# Patient Record
Sex: Male | Born: 1990 | Race: Black or African American | Hispanic: No | Marital: Single | Smoking: Current some day smoker
Health system: Southern US, Community
[De-identification: ages and names within clinical notes are randomized; demographics above are authoritative.]

## PROBLEM LIST (undated history)

## (undated) DIAGNOSIS — R05 Cough: Secondary | ICD-10-CM

## (undated) DIAGNOSIS — J3501 Chronic tonsillitis: Secondary | ICD-10-CM

## (undated) HISTORY — PX: NO PAST SURGERIES: SHX2092

---

## 2011-11-25 ENCOUNTER — Encounter: Payer: Self-pay | Admitting: Adult Health

## 2011-11-25 ENCOUNTER — Emergency Department (HOSPITAL_COMMUNITY)
Admission: EM | Admit: 2011-11-25 | Discharge: 2011-11-25 | Disposition: A | Discharge status: Home / Self Care | Payer: Federal, State, Local not specified - PPO | Attending: Emergency Medicine | Admitting: Emergency Medicine

## 2011-11-25 DIAGNOSIS — H9209 Otalgia, unspecified ear: Secondary | ICD-10-CM | POA: Insufficient documentation

## 2011-11-25 DIAGNOSIS — R059 Cough, unspecified: Secondary | ICD-10-CM | POA: Insufficient documentation

## 2011-11-25 DIAGNOSIS — IMO0001 Reserved for inherently not codable concepts without codable children: Secondary | ICD-10-CM | POA: Insufficient documentation

## 2011-11-25 DIAGNOSIS — R5383 Other fatigue: Secondary | ICD-10-CM | POA: Insufficient documentation

## 2011-11-25 DIAGNOSIS — J029 Acute pharyngitis, unspecified: Secondary | ICD-10-CM | POA: Insufficient documentation

## 2011-11-25 DIAGNOSIS — J3489 Other specified disorders of nose and nasal sinuses: Secondary | ICD-10-CM | POA: Insufficient documentation

## 2011-11-25 DIAGNOSIS — F17200 Nicotine dependence, unspecified, uncomplicated: Secondary | ICD-10-CM | POA: Insufficient documentation

## 2011-11-25 DIAGNOSIS — R509 Fever, unspecified: Secondary | ICD-10-CM | POA: Insufficient documentation

## 2011-11-25 DIAGNOSIS — R599 Enlarged lymph nodes, unspecified: Secondary | ICD-10-CM | POA: Insufficient documentation

## 2011-11-25 DIAGNOSIS — R5381 Other malaise: Secondary | ICD-10-CM | POA: Insufficient documentation

## 2011-11-25 DIAGNOSIS — R05 Cough: Secondary | ICD-10-CM | POA: Insufficient documentation

## 2011-11-25 LAB — RAPID STREP SCREEN (MED CTR MEBANE ONLY): Streptococcus, Group A Screen (Direct): NEGATIVE

## 2011-11-25 MED ORDER — IBUPROFEN 800 MG PO TABS: 800.0000 mg | ORAL_TABLET | Freq: Three times a day (TID) | ORAL | Status: AC | PRN

## 2011-11-25 MED ORDER — ACETAMINOPHEN 325 MG PO TABS: 650.0000 mg | ORAL_TABLET | Freq: Once | ORAL | Status: DC

## 2011-11-25 MED ORDER — IBUPROFEN 800 MG PO TABS
800.0000 mg | ORAL_TABLET | Freq: Once | ORAL | Status: AC
  Administered 2011-11-25: 800 mg via ORAL
  Filled 2011-11-25: qty 1

## 2011-11-25 MED ORDER — AMOXICILLIN 875 MG PO TABS: 875.0000 mg | ORAL_TABLET | Freq: Two times a day (BID) | ORAL | Status: AC

## 2011-11-25 NOTE — ED Provider Notes (Signed)
History     CSN: [id number]  Arrival date & time 11/25/11  1338   First MD Initiated Contact with Patient 11/25/11 1553      Chief Complaint  Patient presents with  . Sore Throat    (Consider location/radiation/quality/duration/timing/severity/associated sxs/prior treatment) Patient is a 20 y.o. male presenting with pharyngitis. The history is provided by the patient.  Sore Throat This is a new problem. The current episode started yesterday. The problem occurs constantly. The problem has been gradually worsening. Associated symptoms include chills, congestion, coughing, fatigue, a fever, myalgias and a sore throat. Pertinent negatives include no abdominal pain, chest pain, headaches, nausea, neck pain, rash, urinary symptoms, vertigo, visual change, vomiting or weakness. The symptoms are aggravated by swallowing. Treatments tried: dayquil. The treatment provided no relief.  Pt did receive a flu shot this year.  Past Medical History  Diagnosis Date  . Tonsillitis     History reviewed. No pertinent past surgical history.  History reviewed. No pertinent family history.  History  Substance Use Topics  . Smoking status: Current Everyday Smoker  . Smokeless tobacco: Not on file  . Alcohol Use: Yes      Review of Systems  Constitutional: Positive for fever, chills and fatigue.  HENT: Positive for ear pain, congestion and sore throat. Negative for hearing loss, nosebleeds, rhinorrhea, drooling, mouth sores, trouble swallowing, neck pain, neck stiffness, voice change, tinnitus and ear discharge.   Respiratory: Positive for cough.   Cardiovascular: Negative for chest pain.  Gastrointestinal: Negative for nausea, vomiting and abdominal pain.  Musculoskeletal: Positive for myalgias. Negative for back pain and gait problem.  Skin: Negative for rash.  Neurological: Negative for vertigo, speech difficulty, weakness and headaches.    Allergies  Review of patient's allergies  indicates no known allergies.  Home Medications  No current outpatient prescriptions on file.  BP 123/65  Pulse 89  Temp(Src) 99.8 F (37.7 C) (Oral)  Resp 19  SpO2 100%  Physical Exam  Nursing note and vitals reviewed. Constitutional: He is oriented to person, place, and time. He appears well-developed and well-nourished. No distress.  HENT:  Head: Normocephalic and atraumatic. No trismus in the jaw.  Right Ear: Hearing, tympanic membrane, external ear and ear canal normal.  Left Ear: Hearing, tympanic membrane, external ear and ear canal normal.  Nose: Nose normal. Right sinus exhibits no maxillary sinus tenderness. Left sinus exhibits no maxillary sinus tenderness.  Mouth/Throat: Mucous membranes are normal. Normal dentition. No uvula swelling. Oropharyngeal exudate, posterior oropharyngeal edema and posterior oropharyngeal erythema present. No tonsillar abscesses.  Eyes: Conjunctivae are normal. Pupils are equal, round, and reactive to light.  Neck: Normal range of motion. Neck supple.  Cardiovascular: Normal rate, regular rhythm and normal heart sounds.   No murmur heard. Pulmonary/Chest: Effort normal and breath sounds normal. No respiratory distress. He has no wheezes. He exhibits no tenderness.       Normal respiratory effort and excursion  Abdominal: Soft. He exhibits no distension. There is no tenderness.  Musculoskeletal: Normal range of motion. He exhibits no edema and no tenderness.  Lymphadenopathy:       Bilateral tender anterior cervical LAD  Neurological: He is alert and oriented to person, place, and time.  Skin: Skin is warm and dry. No rash noted. No erythema.  Psychiatric: He has a normal mood and affect.    ED Course  Procedures (including critical care time)   Labs Reviewed  RAPID STREP SCREEN   No results found.  Dx 1: Pharyngitis   MDM  Exudative pharyngitis x 1 day. 3/4 Centor criteria. Strep screen ordered by nursing staff and is negative,  but will send for culture and treat given symptoms and high clinical suspicion for bacterial etiology. Have also advised use of motrin and warm salt water gargles for fever/discomfort.        Elwyn Reach [place], [place] 11/25/11 [phone]

## 2011-11-25 NOTE — ED Notes (Signed)
Cough, vomitting, fever and sore throat with redness and white patches.

## 2011-11-26 LAB — STREP A DNA PROBE
Group A Strep Probe: NEGATIVE
Special Requests: NORMAL

## 2011-11-26 NOTE — ED Provider Notes (Signed)
Medical screening examination/treatment/procedure(s) were performed by non-physician practitioner and as supervising physician I was immediately available for consultation/collaboration. Sahvannah Rieser Y.   Mekai Wilkinson Y. Galileo Colello, MD 11/26/11 1027 

## 2012-01-15 ENCOUNTER — Encounter: Payer: Self-pay | Admitting: Family Medicine

## 2012-01-15 ENCOUNTER — Ambulatory Visit (INDEPENDENT_AMBULATORY_CARE_PROVIDER_SITE_OTHER): Payer: Federal, State, Local not specified - PPO | Admitting: Family Medicine

## 2012-01-15 DIAGNOSIS — Z0000 Encounter for general adult medical examination without abnormal findings: Secondary | ICD-10-CM

## 2012-01-15 DIAGNOSIS — Z113 Encounter for screening for infections with a predominantly sexual mode of transmission: Secondary | ICD-10-CM

## 2012-01-15 HISTORY — DX: Encounter for general adult medical examination without abnormal findings: Z00.00

## 2012-01-15 LAB — COMPREHENSIVE METABOLIC PANEL
ALT: 16 U/L (ref 0–53)
AST: 24 U/L (ref 0–37)
Albumin: 4.1 g/dL (ref 3.5–5.2)
Alkaline Phosphatase: 60 U/L (ref 39–117)
Glucose, Bld: 81 mg/dL (ref 70–99)
Potassium: 3.7 mEq/L (ref 3.5–5.3)
Sodium: 139 mEq/L (ref 135–145)
Total Bilirubin: 0.9 mg/dL (ref 0.3–1.2)
Total Protein: 7.3 g/dL (ref 6.0–8.3)

## 2012-01-15 LAB — CBC
Hemoglobin: 14.7 g/dL (ref 13.0–17.0)
MCH: 30.1 pg (ref 26.0–34.0)
MCHC: 35.2 g/dL (ref 30.0–36.0)
Platelets: 221 10*3/uL (ref 150–400)

## 2012-01-15 NOTE — Patient Instructions (Signed)

## 2012-01-16 NOTE — Progress Notes (Signed)
Quick Note:  Please notify pt that results are normal. ______ 

## 2012-01-16 NOTE — Progress Notes (Signed)
  Subjective:    Patient ID: Todd Mahoney, male    DOB: Sep 10, 1991, 21 y.o.   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  His Immunizations are UTD . He is a smoker ( 1/4 pack per day) since midteen years.He is   interested in quitting. He is a heterosexual male who has one partner and uses condoms.   Review of Systems  All other systems reviewed and are negative.       Objective:   Physical Exam  Nursing note and vitals reviewed. Constitutional: He is oriented to person, place, and time. He appears well-developed and well-nourished. No distress.  HENT:  Head: Normocephalic and atraumatic.  Right Ear: External ear normal.  Left Ear: External ear normal.  Nose: Nose normal.  Mouth/Throat: Oropharynx is clear and moist.  Eyes: Conjunctivae and EOM are normal. Pupils are equal, round, and reactive to light. Scleral icterus is present.  Neck: Normal range of motion. Neck supple. No thyromegaly present.  Cardiovascular: Normal rate, regular rhythm, normal heart sounds and intact distal pulses.   No murmur heard. Pulmonary/Chest: Effort normal and breath sounds normal. No respiratory distress. He has no wheezes.  Abdominal: Soft. Bowel sounds are normal. He exhibits no distension and no mass. There is no tenderness.  Genitourinary: Penis normal. No penile tenderness.       No penile discharge. Testicles normal and bilat desc. without masses or tenderness  Musculoskeletal: Normal range of motion. He exhibits no edema and no tenderness.  Lymphadenopathy:    He has no cervical adenopathy.  Neurological: He is alert and oriented to person, place, and time. He has normal reflexes. No cranial nerve deficit. Coordination normal.  Skin: Skin is warm and dry.  Psychiatric: He has a normal mood and affect. His behavior is normal. Judgment and thought content normal.          Assessment & Plan:   1. Routine general medical  examination at a health care facility  CBC, Comprehensive metabolic panel  2. Screen for STD (sexually transmitted disease)  HIV antibody; advised to continue safe sex practices  3. Health care maintenance  Tobacco cessation encouraged

## 2012-02-29 ENCOUNTER — Ambulatory Visit: Payer: Federal, State, Local not specified - PPO | Admitting: Family Medicine

## 2012-03-18 ENCOUNTER — Ambulatory Visit (INDEPENDENT_AMBULATORY_CARE_PROVIDER_SITE_OTHER): Payer: Federal, State, Local not specified - PPO | Admitting: Family Medicine

## 2012-03-18 DIAGNOSIS — F17200 Nicotine dependence, unspecified, uncomplicated: Secondary | ICD-10-CM

## 2012-03-18 DIAGNOSIS — J309 Allergic rhinitis, unspecified: Secondary | ICD-10-CM

## 2012-03-18 DIAGNOSIS — Z720 Tobacco use: Secondary | ICD-10-CM

## 2012-03-18 DIAGNOSIS — J302 Other seasonal allergic rhinitis: Secondary | ICD-10-CM

## 2012-03-18 MED ORDER — CETIRIZINE HCL 10 MG PO TABS: 10.0000 mg | ORAL_TABLET | Freq: Every day | ORAL | Status: DC

## 2012-03-18 NOTE — Progress Notes (Signed)
  Subjective:    Patient ID: Todd Mahoney, male    DOB: 10/13/1991, 21 y.o.   MRN: [id number]  HPI  This young man c/o 4-day hx ofdry cough, awakening him in middle of night. Also itchy, watery eyes  and sore throat. He spends some time outdoors walking to classes on college campus. He smokes  ~ 1 pack per week; seriously planning to quit. No prior hx of severe allergies. He also c/o bilat arm   discomfort,localized around elbows; this has resolved and he thinks it is related to his sleep positions.  The discomfort was not severe enough for oral analgesics. He had no swelling or decreased ROM or  warmth in the joints.    Review of Systems  Constitutional: Negative.   HENT: Positive for congestion, sore throat, rhinorrhea and sneezing. Negative for nosebleeds, trouble swallowing and sinus pressure.   Eyes: Positive for redness and itching.  Respiratory: Positive for cough. Negative for choking, chest tightness, shortness of breath and wheezing.   Cardiovascular: Positive for chest pain.  Skin: Negative.   Neurological: Negative for dizziness and headaches.       Objective:   Physical Exam  Nursing note and vitals reviewed. Constitutional: He is oriented to person, place, and time. He appears well-developed and well-nourished. No distress.  HENT:  Head: Normocephalic and atraumatic.  Right Ear: External ear normal.  Left Ear: External ear normal.  Mouth/Throat: No oropharyngeal exudate.       Post pharynx with redness and cobblestoning; nasal mucosa red and boggy with clear nasal drainage  Eyes: EOM are normal. Right eye exhibits no discharge. Left eye exhibits no discharge. No scleral icterus.       Conj injected  Neck: Neck supple.  Cardiovascular: Normal rate.   Pulmonary/Chest: Effort normal and breath sounds normal. No respiratory distress.  Musculoskeletal: Normal range of motion. He exhibits no edema and no tenderness.  Lymphadenopathy:    He has no cervical adenopathy.    Neurological: He is alert and oriented to person, place, and time.  Skin: Skin is warm and dry. No rash noted.          Assessment & Plan:   1. Allergic rhinitis      RX: Cetirizine HCl 10 mg   1 tablet daily in the PM   2. Tobacco user    Strongly advised cessation

## 2012-03-18 NOTE — Patient Instructions (Addendum)
Allergic Rhinitis Allergic rhinitis is when the mucous membranes in the nose respond to allergens. Allergens are particles in the air that cause your body to have an allergic reaction. This causes you to release allergic antibodies. Through a chain of events, these eventually cause you to release histamine into the blood stream (hence the use of antihistamines). Although meant to be protective to the body, it is this release that causes your discomfort, such as frequent sneezing, congestion and an itchy runny nose.  CAUSES  The pollen allergens may come from grasses, trees, and weeds. This is seasonal allergic rhinitis, or "hay fever." Other allergens cause year-round allergic rhinitis (perennial allergic rhinitis) such as house dust mite allergen, pet dander and mold spores.  SYMPTOMS   Nasal stuffiness (congestion).   Runny, itchy nose with sneezing and tearing of the eyes.   There is often an itching of the mouth, eyes and ears.  It cannot be cured, but it can be controlled with medications. DIAGNOSIS  If you are unable to determine the offending allergen, skin or blood testing may find it. TREATMENT   Avoid the allergen.   Medications and allergy shots (immunotherapy) can help.   Hay fever may often be treated with antihistamines in pill or nasal spray forms. Antihistamines block the effects of histamine. There are over-the-counter medicines that may help with nasal congestion and swelling around the eyes. Check with your caregiver before taking or giving this medicine.  If the treatment above does not work, there are many new medications your caregiver can prescribe. Stronger medications may be used if initial measures are ineffective. Desensitizing injections can be used if medications and avoidance fails. Desensitization is when a patient is given ongoing shots until the body becomes less sensitive to the allergen. Make sure you follow up with your caregiver if problems continue. SEEK  MEDICAL CARE IF:   You develop fever (more than 100.5 F (38.1 C).   You develop a cough that does not stop easily (persistent).   You have shortness of breath.   You start wheezing.   Symptoms interfere with normal daily activities.  Document Released: 08/07/2001 Document Revised: 11/01/2011 Document Reviewed: 02/16/2009 [facility] Patient Information 2012 [place], [place].   Discontinue tobacco use !!

## 2012-03-19 ENCOUNTER — Encounter: Payer: Self-pay | Admitting: Family Medicine

## 2012-03-19 DIAGNOSIS — Z720 Tobacco use: Secondary | ICD-10-CM | POA: Insufficient documentation

## 2012-03-19 DIAGNOSIS — J302 Other seasonal allergic rhinitis: Secondary | ICD-10-CM | POA: Insufficient documentation

## 2012-05-12 ENCOUNTER — Telehealth: Payer: Self-pay

## 2012-05-12 NOTE — Telephone Encounter (Signed)
Pt called and states that "[employer]" faxed [place] a form last month to confirm the health of his arms. He is calling to confirm this form has been completed and returned to [facility] as he has not heard anything from [place].

## 2012-05-13 NOTE — Telephone Encounter (Signed)
MR-- Please see if form has been completed, if not-- pull chart for clinical.

## 2012-05-14 NOTE — Telephone Encounter (Signed)
Chart is at the nurses station in the phone message pile.

## 2012-05-14 NOTE — Telephone Encounter (Signed)
[facility] notifying patient we do not appear to have received form.  Please re-fax and we can complete.  CB if any questions.

## 2012-05-14 NOTE — Telephone Encounter (Signed)
Patient does not have a chart he has only been seen in Epic.

## 2012-06-10 ENCOUNTER — Encounter: Payer: Self-pay | Admitting: Family Medicine

## 2012-10-09 ENCOUNTER — Emergency Department (HOSPITAL_COMMUNITY)
Admission: EM | Admit: 2012-10-09 | Discharge: 2012-10-09 | Disposition: A | Discharge status: Home / Self Care | Payer: Federal, State, Local not specified - PPO | Source: Home / Self Care | Attending: Family Medicine | Admitting: Family Medicine

## 2012-10-09 ENCOUNTER — Encounter (HOSPITAL_COMMUNITY): Payer: Self-pay | Admitting: *Deleted

## 2012-10-09 DIAGNOSIS — L0201 Cutaneous abscess of face: Secondary | ICD-10-CM

## 2012-10-09 DIAGNOSIS — J329 Chronic sinusitis, unspecified: Secondary | ICD-10-CM

## 2012-10-09 MED ORDER — FLUTICASONE PROPIONATE 50 MCG/ACT NA SUSP: 2.0000 | Freq: Every day | NASAL | Status: DC

## 2012-10-09 MED ORDER — DOXYCYCLINE HYCLATE 100 MG PO CAPS: 100.0000 mg | ORAL_CAPSULE | Freq: Two times a day (BID) | ORAL | Status: DC

## 2012-10-09 MED ORDER — CETIRIZINE-PSEUDOEPHEDRINE ER 5-120 MG PO TB12: 1.0000 | ORAL_TABLET | Freq: Two times a day (BID) | ORAL | Status: DC

## 2012-10-09 MED ORDER — PREDNISONE 20 MG PO TABS: ORAL_TABLET | ORAL | Status: DC

## 2012-10-09 MED ORDER — IBUPROFEN 600 MG PO TABS: 600.0000 mg | ORAL_TABLET | Freq: Three times a day (TID) | ORAL | Status: DC | PRN

## 2012-10-09 NOTE — ED Notes (Signed)
Pt reports sore throat, ear ache and abscess on chin.

## 2012-10-12 LAB — CULTURE, ROUTINE-ABSCESS: Gram Stain: NONE SEEN

## 2012-10-13 NOTE — ED Provider Notes (Signed)
History     CSN: [id number]  Arrival date & time 10/09/12  1305   First MD Initiated Contact with Patient 10/09/12 1326      Chief Complaint  Patient presents with  . URI  . Abscess  . Sore Throat    (Consider location/radiation/quality/duration/timing/severity/associated sxs/prior treatment) HPI Comments: 21 y/o smoker male with h/o chronic rhinitis here with following complaints:  1) nasal congestion, yellow rhinorrhea, sinus pressure, headache, nasal voice. States he has had allergy symptoms for several weeks but symptoms worse in last 2 weeks.  Now also with sore throat, nausea and decreased appetite. Denies cough, chest pain , wheezing or shortness of breath. No fever.   2) abscess in chin for 3 days. Area tender and swollen try to express but no drainage.    Past Medical History  Diagnosis Date  . Tonsillitis     History reviewed. No pertinent past surgical history.  Family History  Problem Relation Age of Onset  . Family history unknown: Yes    History  Substance Use Topics  . Smoking status: Current Every Day Smoker -- 0.5 packs/day for 8 years    Types: Cigarettes  . Smokeless tobacco: Not on file  . Alcohol Use: No      Review of Systems  Constitutional: Positive for appetite change. Negative for fever, chills, activity change and fatigue.  HENT: Positive for ear pain, congestion, sore throat, rhinorrhea and sinus pressure.   Eyes: Negative for discharge.  Respiratory: Negative for cough, shortness of breath and wheezing.   Cardiovascular: Negative for chest pain.  Gastrointestinal: Positive for nausea. Negative for vomiting, abdominal pain and diarrhea.  Musculoskeletal: Negative for myalgias and arthralgias.  Skin:       Tender swollen area in chin as per HPI.  Neurological: Positive for headaches.  All other systems reviewed and are negative.    Allergies  Review of patient's allergies indicates no known allergies.  Home Medications    Current Outpatient Rx  Name  Route  Sig  Dispense  Refill  . CETIRIZINE-PSEUDOEPHEDRINE ER 5-120 MG PO TB12   Oral   Take 1 tablet by mouth 2 (two) times daily.   30 tablet   0   . DOXYCYCLINE HYCLATE 100 MG PO CAPS   Oral   Take 1 capsule (100 mg total) by mouth 2 (two) times daily.   20 capsule   0   . FLUTICASONE PROPIONATE 50 MCG/ACT NA SUSP   Nasal   Place 2 sprays into the nose daily.   16 g   0   . IBUPROFEN 600 MG PO TABS   Oral   Take 1 tablet (600 mg total) by mouth every 8 (eight) hours as needed for pain.   20 tablet   0   . PREDNISONE 20 MG PO TABS      2 tabs by mouth daily for 5 days   10 tablet   0     BP 131/80  Pulse 98  Temp 98.9 F (37.2 C) (Oral)  Resp 17  SpO2 99%  Physical Exam  Nursing note and vitals reviewed. Constitutional: He is oriented to person, place, and time. He appears well-developed and well-nourished. No distress.  HENT:  Head: Normocephalic and atraumatic.       Nasal Congestion with erythema and swelling of nasal turbinates, yellow rhinorrhea. Significant pharyngeal erythema no exudates. No uvula deviation. No trismus. TM's with increased vascular markings and some dullness bilaterally no swelling or bulging. Wax buildup  bilaterally without complete obstruction.   Eyes: Conjunctivae normal and EOM are normal. Pupils are equal, round, and reactive to light. Right eye exhibits no discharge. Left eye exhibits no discharge.  Neck: Neck supple. No thyromegaly present.  Cardiovascular: Normal rate, regular rhythm and normal heart sounds.   Pulmonary/Chest: Effort normal and breath sounds normal. He has no wheezes. He has no rales.  Lymphadenopathy:    He has no cervical adenopathy.  Neurological: He is alert and oriented to person, place, and time.  Skin: He is not diaphoretic.       Area of 2 cm swelling, erythema, induration and tenderness in left lower chin.     ED Course  INCISION AND DRAINAGE Performed by:  Sharin Grave Authorized by: Sharin Grave Consent: Verbal consent obtained. Risks and benefits: risks, benefits and alternatives were discussed Consent given by: patient Patient understanding: patient states understanding of the procedure being performed Patient consent: the patient's understanding of the procedure matches consent given Type: abscess Body area: head/neck Location details: face Anesthesia: local infiltration Local anesthetic: lidocaine 1% with epinephrine Anesthetic total: 1 ml Scalpel size: 11 Incision type: single straight Complexity: simple Drainage: purulent Drainage amount: scant Patient tolerance: Patient tolerated the procedure well with no immediate complications.   (including critical care time)   Labs Reviewed  CULTURE, ROUTINE-ABSCESS  LAB REPORT - SCANNED   No results found.   1. Sinusitis   2. Abscess of face       MDM  Face abscess s/p I&D today. Treated with doxycycline, Flonase, ibuprofen and prednsisone. Wound care instructions and red flags that should prompt return to medical attention provided.        Sharin Grave, MD 10/15/12 [phone]

## 2012-10-16 NOTE — ED Notes (Signed)
Abscess culture L face: few Staph. Aureus.  Pt. adequately treated with Doxycycline. Vassie Moselle 10/16/2012

## 2013-02-27 ENCOUNTER — Encounter: Payer: Federal, State, Local not specified - PPO | Admitting: Family Medicine

## 2013-04-14 ENCOUNTER — Encounter: Payer: Self-pay | Admitting: Family Medicine

## 2013-04-14 ENCOUNTER — Ambulatory Visit (INDEPENDENT_AMBULATORY_CARE_PROVIDER_SITE_OTHER): Payer: Federal, State, Local not specified - PPO | Admitting: Family Medicine

## 2013-04-14 VITALS — BP 119/78 | HR 58 | Temp 98.0°F | Resp 16 | Ht 70.5 in | Wt 208.0 lb

## 2013-04-14 DIAGNOSIS — Z0000 Encounter for general adult medical examination without abnormal findings: Secondary | ICD-10-CM

## 2013-04-14 NOTE — Patient Instructions (Addendum)
Keeping you healthy  Get these tests  Blood pressure- Have your blood pressure checked once a year by your healthcare provider.  Normal blood pressure is 120/80.  Weight- Have your body mass index (BMI) calculated to screen for obesity.  BMI is a measure of body fat based on height and weight. You can also calculate your own BMI at https://www.west-esparza.com/.  Cholesterol- Have your cholesterol checked regularly starting at age 22, sooner may be necessary if you have diabetes, high blood pressure, if a family member developed heart diseases at an early age or if you smoke.   Chlamydia, HIV, and other sexual transmitted disease- Get screened each year until the age of 20 then within three months of each new sexual partner.  Diabetes- Have your blood sugar checked regularly if you have high blood pressure, high cholesterol, a family history of diabetes or if you are overweight.  Get these vaccines  Flu shot- Every fall.  Tetanus shot- Every 10 years.  Menactra- Single dose; prevents meningitis.  Take these steps  Don't smoke- If you do smoke, ask your healthcare provider about quitting. For tips on how to quit, go to www.smokefree.gov or call 1-800-QUIT-NOW.  Be physically active- Exercise 5 days a week for at least 30 minutes.  If you are not already physically active start slow and gradually work up to 30 minutes of moderate physical activity.  Examples of moderate activity include walking briskly, mowing the yard, dancing, swimming bicycling, etc.  Eat a healthy diet- Eat a variety of healthy foods such as fruits, vegetables, low fat milk, low fat cheese, yogurt, lean meats, poultry, fish, beans, tofu, etc.  For more information on healthy eating, go to www.thenutritionsource.org  Drink alcohol in moderation- Limit alcohol intake two drinks or less a day.  Never drink and drive.  Dentist- Brush and floss teeth twice daily; visit your dentis twice a year.  Depression-Your emotional  health is as important as your physical health.  If you're feeling down, losing interest in things you normally enjoy please talk with your healthcare provider.  Gun Safety- If you keep a gun in your home, keep it unloaded and with the safety lock on.  Bullets should be stored separately.  Helmet use- Always wear a helmet when riding a motorcycle, bicycle, rollerblading or skateboarding.  Safe sex- If you may be exposed to a sexually transmitted infection, use a condom  Seat belts- Seat bels can save your life; always wear one.  Smoke/Carbon Monoxide detectors- These detectors need to be installed on the appropriate level of your home.  Replace batteries at least once a year.  Skin Cancer- When out in the sun, cover up and use sunscreen SPF 15 or higher.  Violence- If anyone is threatening or hurting you, please tell your healthcare provider.   Skin care regimen- use a mild cleanser on your face; try Cetaphil and wash face in morning and in evening before going to bed.  Also use a product called "Bio-Oil" to moisturize and even out your skin ton (help to fade the lesion on your left cheek). If the area on your cheek does not resolve or gets larger, schedule a follow-up visit.

## 2013-04-14 NOTE — Progress Notes (Signed)
Subjective:    Patient ID: Todd Mahoney, male    DOB: 14-Feb-1991, 22 y.o.   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 He had STD testing  on campus at  [employer] ~ 1 month ago. Allergic rhinitis symptoms are mild and pt rarely uses  Fluticasone. He is in a monogamous heterosexual relationship and uses condoms.  Girlfriend uses OCPs. 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   Pt  Is a 1/2 ppd smoker and consumes 2-3 beers, twice a month.  He does exercise 2x/week for about 2 hours.   PMHx, Soc Hx and Fam Hx reviewed.   Review of Systems  HENT: Positive for rhinorrhea and sneezing. Negative for congestion, sore throat, trouble swallowing, voice change, postnasal drip and sinus pressure.   Eyes: Positive for redness and itching. Negative for pain and discharge.  Skin: Positive for color change.       Dark flat lesion on L cheek- pt noticed spot one morning; no itch or abrasion or break in skin. No change in size over 1 month. Uses shower gel to cleanse face.  All other systems reviewed and are negative.       Objective:   Physical Exam  Nursing note and vitals reviewed. Constitutional: He is oriented to person, place, and time. Vital signs are normal. He appears well-developed and well-nourished. No distress.  HENT:  Head: Normocephalic and atraumatic.  Right Ear: Hearing, tympanic membrane, external ear and ear canal normal.  Left Ear: Hearing, tympanic membrane, external ear and ear canal normal.  Nose: Nose normal. No mucosal edema, rhinorrhea, nasal deformity or septal deviation.  Mouth/Throat: Uvula is midline and mucous membranes are normal. No oral lesions. Normal dentition. No dental caries. Posterior oropharyngeal erythema present. No oropharyngeal exudate or posterior oropharyngeal edema.  Tonsild are 1+ enlarged with few crypts noted.  Eyes: EOM and lids are normal. Pupils are equal, round, and  reactive to light. Right conjunctiva is injected. Left conjunctiva is injected. No scleral icterus.  Fundoscopic exam:      The right eye shows no arteriolar narrowing, no AV nicking and no papilledema. The right eye shows red reflex.       The left eye shows no arteriolar narrowing, no AV nicking and no papilledema. The left eye shows red reflex.  Pt wears contacts and has vision evaluation every 12-18 months.  Neck: Normal range of motion. Neck supple. No JVD present. No thyromegaly present.  Thyroid gland is slightly enlarged but this is symmetric w/o nodules; gland is not tender.  Cardiovascular: Normal rate, regular rhythm, normal heart sounds and intact distal pulses.  Exam reveals no gallop and no friction rub.   No murmur heard. Pulmonary/Chest: Effort normal and breath sounds normal. No respiratory distress. He has no wheezes. He has no rales.  Abdominal: Soft. Normal appearance and bowel sounds are normal. He exhibits no distension and no mass. There is no hepatosplenomegaly. There is no tenderness. There is no guarding and no CVA tenderness.  Genitourinary:  Exam deferred.  Musculoskeletal: Normal range of motion. He exhibits no edema and no tenderness.  Lymphadenopathy:    He has no cervical adenopathy.  Neurological: He is alert and oriented to person, place, and time. He has normal reflexes. No cranial nerve deficit. He exhibits normal muscle tone. Coordination normal.  Skin: Skin is warm and dry. No rash noted. No erythema.  Facial area- L  lower cheek w/ 0.5 cm flat hyperpigmented lesion (c/w post-abrasion scar).  Psychiatric: He has a normal mood and affect. His behavior is normal. Judgment and thought content normal.         Assessment & Plan:  Routine general medical examination at a health care facility- Healthy young adult male; he is given some skin care advise  (use mild cleanser like Cetaphil on face AM and PM; Bio-Oil for even skin tone and minimize  discoloration-use a few drops 2x/daily). RTC if skin lesion does not improve or worsens or new lesions appear.

## 2016-03-26 DIAGNOSIS — J3501 Chronic tonsillitis: Secondary | ICD-10-CM

## 2016-03-26 HISTORY — DX: Chronic tonsillitis: J35.01

## 2016-04-02 ENCOUNTER — Other Ambulatory Visit: Payer: Self-pay | Admitting: Otolaryngology

## 2016-04-02 NOTE — H&P (Signed)
PREOPERATIVE H&P  Chief Complaint: frequent tonsil infections  HPI: Todd Mahoney is a 25 y.o. male who presents for evaluation of frequent tonsil infections. He is a Consulting civil engineerstudent at [employer]. He's recently been treated for 2 separate infections and is doing better now. He's always had large tonsils and has previously been recommended tonsillectomy. He's taken to the OR for tonsillectomy.  Past Medical History  Diagnosis Date  . Tonsillitis    No past surgical history on file. Social History   Social History  . Marital Status: Single    Spouse Name: N/A  . Number of Children: N/A  . Years of Education: N/A   Occupational History  . student    Social History Main Topics  . Smoking status: Current Every Day Smoker -- 0.50 packs/day for 8 years    Types: Cigarettes  . Smokeless tobacco: Not on file  . Alcohol Use: No  . Drug Use: No  . Sexual Activity: Yes   Other Topics Concern  . Not on file   Social History Narrative  . No narrative on file   Family History  Problem Relation Age of Onset  . Diabetes Mother    No Known Allergies Prior to Admission medications   Medication Sig Start Date End Date Taking? Authorizing Provider  cetirizine-pseudoephedrine (ZYRTEC-D) 5-120 MG per tablet Take 1 tablet by mouth 2 (two) times daily. 10/09/12   [name], MD  fluticasone (FLONASE) 50 MCG/ACT nasal spray Place 2 sprays into the nose daily. 10/09/12   [name], MD  ibuprofen (ADVIL,MOTRIN) 600 MG tablet Take 1 tablet (600 mg total) by mouth every 8 (eight) hours as needed for pain. 10/09/12   [name], MD     Positive ROS: per HPI  All other systems have been reviewed and were otherwise negative with the exception of those mentioned in the HPI and as above.  Physical Exam: There were no vitals filed for this visit.  General: Alert, no acute distress Oral: Normal oral mucosa and 3+ tonsils Nasal: Clear nasal passages Neck: No palpable adenopathy or  thyroid nodules Ear: Ear canal is clear with normal appearing TMs Cardiovascular: Regular rate and rhythm, no murmur.  Respiratory: Clear to auscultation Neurologic: Alert and oriented x 3   Assessment/Plan: CHRONIC TONSILLITIS Plan for Procedure(s): BILATERAL TONSILLECTOMY   Dillard CannonHRISTOPHER NEWMAN, MD 04/02/2016 5:11 PM

## 2016-04-03 ENCOUNTER — Encounter (HOSPITAL_BASED_OUTPATIENT_CLINIC_OR_DEPARTMENT_OTHER): Payer: Self-pay | Admitting: *Deleted

## 2016-04-03 DIAGNOSIS — R059 Cough, unspecified: Secondary | ICD-10-CM

## 2016-04-03 HISTORY — DX: Cough, unspecified: R05.9

## 2016-04-06 ENCOUNTER — Encounter (HOSPITAL_BASED_OUTPATIENT_CLINIC_OR_DEPARTMENT_OTHER)
Admission: RE | Disposition: A | Discharge status: Home / Self Care | Payer: Self-pay | Source: Ambulatory Visit | Attending: Otolaryngology

## 2016-04-06 ENCOUNTER — Ambulatory Visit (HOSPITAL_BASED_OUTPATIENT_CLINIC_OR_DEPARTMENT_OTHER)
Admission: RE | Admit: 2016-04-06 | Discharge: 2016-04-06 | Disposition: A | Discharge status: Home / Self Care | Payer: BLUE CROSS/BLUE SHIELD | Source: Ambulatory Visit | Attending: Otolaryngology | Admitting: Otolaryngology

## 2016-04-06 ENCOUNTER — Ambulatory Visit (HOSPITAL_BASED_OUTPATIENT_CLINIC_OR_DEPARTMENT_OTHER): Payer: BLUE CROSS/BLUE SHIELD | Admitting: Anesthesiology

## 2016-04-06 ENCOUNTER — Encounter (HOSPITAL_BASED_OUTPATIENT_CLINIC_OR_DEPARTMENT_OTHER): Payer: Self-pay | Admitting: *Deleted

## 2016-04-06 DIAGNOSIS — J3501 Chronic tonsillitis: Secondary | ICD-10-CM | POA: Insufficient documentation

## 2016-04-06 DIAGNOSIS — F17210 Nicotine dependence, cigarettes, uncomplicated: Secondary | ICD-10-CM | POA: Insufficient documentation

## 2016-04-06 HISTORY — DX: Chronic tonsillitis: J35.01

## 2016-04-06 HISTORY — PX: TONSILLECTOMY: SHX5217

## 2016-04-06 HISTORY — DX: Cough: R05

## 2016-04-06 SURGERY — TONSILLECTOMY
Anesthesia: General | Site: Mouth | Laterality: Bilateral

## 2016-04-06 MED ORDER — HYDRALAZINE HCL 20 MG/ML IJ SOLN
5.0000 mg | Freq: Once | INTRAMUSCULAR | Status: AC
  Administered 2016-04-06: 5 mg via INTRAVENOUS

## 2016-04-06 MED ORDER — PROPOFOL 10 MG/ML IV BOLUS
INTRAVENOUS | Status: AC
  Filled 2016-04-06: qty 20

## 2016-04-06 MED ORDER — LABETALOL HCL 5 MG/ML IV SOLN
10.0000 mg | INTRAVENOUS | Status: DC | PRN
  Administered 2016-04-06: 10 mg via INTRAVENOUS

## 2016-04-06 MED ORDER — DEXAMETHASONE SODIUM PHOSPHATE 4 MG/ML IJ SOLN
INTRAMUSCULAR | Status: DC | PRN
  Administered 2016-04-06: 10 mg via INTRAVENOUS

## 2016-04-06 MED ORDER — ONDANSETRON HCL 4 MG/2ML IJ SOLN: 4.0000 mg | Freq: Once | INTRAMUSCULAR | Status: DC | PRN

## 2016-04-06 MED ORDER — FENTANYL CITRATE (PF) 100 MCG/2ML IJ SOLN
INTRAMUSCULAR | Status: AC
  Filled 2016-04-06: qty 2

## 2016-04-06 MED ORDER — FENTANYL CITRATE (PF) 100 MCG/2ML IJ SOLN
50.0000 ug | INTRAMUSCULAR | Status: AC | PRN
  Administered 2016-04-06: 100 ug via INTRAVENOUS
  Administered 2016-04-06: 50 ug via INTRAVENOUS
  Administered 2016-04-06: 100 ug via INTRAVENOUS

## 2016-04-06 MED ORDER — HYDROCODONE-ACETAMINOPHEN 7.5-325 MG/15ML PO SOLN: 10.0000 mL | ORAL | Status: DC | PRN

## 2016-04-06 MED ORDER — HYDRALAZINE HCL 20 MG/ML IJ SOLN
3.0000 mg | Freq: Once | INTRAMUSCULAR | Status: AC
  Administered 2016-04-06: 3 mg via INTRAVENOUS

## 2016-04-06 MED ORDER — LIDOCAINE HCL (CARDIAC) 20 MG/ML IV SOLN
INTRAVENOUS | Status: DC | PRN
  Administered 2016-04-06: 50 mg via INTRAVENOUS

## 2016-04-06 MED ORDER — SUCCINYLCHOLINE CHLORIDE 20 MG/ML IJ SOLN
INTRAMUSCULAR | Status: DC | PRN
  Administered 2016-04-06: 120 mg via INTRAVENOUS

## 2016-04-06 MED ORDER — OXYCODONE HCL 5 MG/5ML PO SOLN
5.0000 mg | Freq: Once | ORAL | Status: AC
Start: 2016-04-06 — End: 2016-04-06
  Administered 2016-04-06: 5 mg via ORAL

## 2016-04-06 MED ORDER — AMOXICILLIN 250 MG/5ML PO SUSR: 500.0000 mg | Freq: Two times a day (BID) | ORAL | Status: AC

## 2016-04-06 MED ORDER — SCOPOLAMINE 1 MG/3DAYS TD PT72: 1.0000 | MEDICATED_PATCH | Freq: Once | TRANSDERMAL | Status: DC | PRN

## 2016-04-06 MED ORDER — LACTATED RINGERS IV SOLN
INTRAVENOUS | Status: DC
  Administered 2016-04-06 (×2): via INTRAVENOUS

## 2016-04-06 MED ORDER — OXYCODONE HCL 5 MG/5ML PO SOLN
ORAL | Status: AC
  Filled 2016-04-06: qty 5

## 2016-04-06 MED ORDER — HYDROMORPHONE HCL 1 MG/ML IJ SOLN
INTRAMUSCULAR | Status: AC
  Filled 2016-04-06: qty 1

## 2016-04-06 MED ORDER — ONDANSETRON HCL 4 MG/2ML IJ SOLN
INTRAMUSCULAR | Status: DC | PRN
  Administered 2016-04-06: 4 mg via INTRAVENOUS

## 2016-04-06 MED ORDER — HYDRALAZINE HCL 20 MG/ML IJ SOLN
INTRAMUSCULAR | Status: AC
  Filled 2016-04-06: qty 1

## 2016-04-06 MED ORDER — CEFAZOLIN SODIUM-DEXTROSE 2-4 GM/100ML-% IV SOLN
2.0000 g | INTRAVENOUS | Status: AC
  Administered 2016-04-06: 2 g via INTRAVENOUS

## 2016-04-06 MED ORDER — GLYCOPYRROLATE 0.2 MG/ML IJ SOLN: 0.2000 mg | Freq: Once | INTRAMUSCULAR | Status: DC | PRN

## 2016-04-06 MED ORDER — MIDAZOLAM HCL 2 MG/2ML IJ SOLN
1.0000 mg | INTRAMUSCULAR | Status: DC | PRN
Start: 2016-04-06 — End: 2016-04-06
  Administered 2016-04-06: 2 mg via INTRAVENOUS

## 2016-04-06 MED ORDER — MIDAZOLAM HCL 2 MG/2ML IJ SOLN
INTRAMUSCULAR | Status: AC
  Filled 2016-04-06: qty 2

## 2016-04-06 MED ORDER — PROPOFOL 10 MG/ML IV BOLUS
INTRAVENOUS | Status: DC | PRN
  Administered 2016-04-06: 200 mg via INTRAVENOUS

## 2016-04-06 MED ORDER — LABETALOL HCL 5 MG/ML IV SOLN
INTRAVENOUS | Status: AC
  Filled 2016-04-06: qty 4

## 2016-04-06 MED ORDER — DEXAMETHASONE SODIUM PHOSPHATE 10 MG/ML IJ SOLN: 10.0000 mg | Freq: Once | INTRAMUSCULAR | Status: DC

## 2016-04-06 MED ORDER — HYDROMORPHONE HCL 1 MG/ML IJ SOLN
0.5000 mg | INTRAMUSCULAR | Status: DC | PRN
  Administered 2016-04-06 (×2): 0.5 mg via INTRAVENOUS

## 2016-04-06 SURGICAL SUPPLY — 32 items
BANDAGE COBAN STERILE 2 (GAUZE/BANDAGES/DRESSINGS) IMPLANT
CANISTER SUCT 1200ML W/VALVE (MISCELLANEOUS) ×3 IMPLANT
CATH ROBINSON RED A/P 12FR (CATHETERS) IMPLANT
COAGULATOR SUCT 6 FR SWTCH (ELECTROSURGICAL)
COAGULATOR SUCT SWTCH 10FR 6 (ELECTROSURGICAL) IMPLANT
COVER MAYO STAND STRL (DRAPES) ×3 IMPLANT
ELECT COATED BLADE 2.86 ST (ELECTRODE) ×3 IMPLANT
ELECT REM PT RETURN 9FT ADLT (ELECTROSURGICAL)
ELECT REM PT RETURN 9FT PED (ELECTROSURGICAL)
ELECTRODE REM PT RETRN 9FT PED (ELECTROSURGICAL) IMPLANT
ELECTRODE REM PT RTRN 9FT ADLT (ELECTROSURGICAL) IMPLANT
GLOVE BIO SURGEON STRL SZ 6.5 (GLOVE) ×2 IMPLANT
GLOVE BIO SURGEONS STRL SZ 6.5 (GLOVE) ×1
GLOVE BIOGEL PI IND STRL 7.0 (GLOVE) ×2 IMPLANT
GLOVE BIOGEL PI INDICATOR 7.0 (GLOVE) ×4
GLOVE ECLIPSE 6.5 STRL STRAW (GLOVE) ×3 IMPLANT
GLOVE SS BIOGEL STRL SZ 7.5 (GLOVE) ×1 IMPLANT
GLOVE SUPERSENSE BIOGEL SZ 7.5 (GLOVE) ×2
GOWN STRL REUS W/ TWL LRG LVL3 (GOWN DISPOSABLE) ×2 IMPLANT
GOWN STRL REUS W/TWL LRG LVL3 (GOWN DISPOSABLE) ×4
MARKER SKIN DUAL TIP RULER LAB (MISCELLANEOUS) IMPLANT
NS IRRIG 1000ML POUR BTL (IV SOLUTION) ×3 IMPLANT
PENCIL FOOT CONTROL (ELECTRODE) ×3 IMPLANT
SHEET MEDIUM DRAPE 40X70 STRL (DRAPES) ×3 IMPLANT
SOLUTION BUTLER CLEAR DIP (MISCELLANEOUS) ×3 IMPLANT
SPONGE GAUZE 4X4 12PLY STER LF (GAUZE/BANDAGES/DRESSINGS) ×3 IMPLANT
SPONGE TONSIL 1 RF SGL (DISPOSABLE) IMPLANT
SPONGE TONSIL 1.25 RF SGL STRG (GAUZE/BANDAGES/DRESSINGS) IMPLANT
SYR BULB 3OZ (MISCELLANEOUS) ×3 IMPLANT
TOWEL OR 17X24 6PK STRL BLUE (TOWEL DISPOSABLE) ×3 IMPLANT
TUBE CONNECTING 20'X1/4 (TUBING) ×1
TUBE CONNECTING 20X1/4 (TUBING) ×2 IMPLANT

## 2016-04-06 NOTE — Discharge Instructions (Addendum)
Instructions for Home Care After Tonsillectomy  First Day Home: Encourage fluid intake by frequently offering liquids, soup, ice cream jello, etc.  Drink several glasses of water.  Cooler fluids are best.  Avoid hot and highly seasoned foods.  Orange juice, grapefruit juice and tomato juice may cause stinging sensation because of their acidic content.    Second and Third Day Home: Continue liquids and add soft foods, (pudding, macaroni and cheese, mashed potatoes, soft scrambled eggs, etc.).  Make sure you drink plenty of liquids so you do not get dehydrated.  Fifth Thru Seventh Day Home: Gradually resume a normal diet, but avoid hot foods, potato chips, nuts, toast and crackers until 2 weeks after surgery.  General Instructions   No undue physical exertion or exercise for one week.  Children: Tylenol may be used for discomfort and/or fever.  Use as often as necessary within limits of the directions.  Adults: May spray throat with Chloroseptic or other topical anesthetic for discomfort and use pain medication obtained by prescription as directed.    A slight fever (up to 101) is expected for the first the first couple of days.  Take Tylenol (or aspirin substitute) as directed.  Pain in ears is common after tonsillectomy.  It represents pain referred from the throat where the tonsils were removed.  There is usually nothing wrong with the ears in most cases.  Administer Tylenol as needed to control this pain.  White patches will form where the tonsils were removed.  This is perfectly normal.  They will disappear in one to two weeks.  Mouth odor may be notice during the healing stages.  Do not use aspirin for two weeks; it increases the possibility of bleeding.  In a very small percentage of people, there is some bleeding after five to six days.  If this happens, do not become excited, for the bleeding is usually light.  Be quiet, lie down, and spit the blood out gently.  Gargle the throat  with ice water.  If the bleeding does not stop promptly, call the office [phone]), which answers 24 hours a day.  A follow up appointment should be made with Dr. Ezzard StandingNewman 10-14 days following surgery. Please call [phone] for the appointment time.  Take your regular meds. Tylenol, motrin or Hydrocodone elixir 10-15 cc every 4-6 hrs prn pain Start Amoxicillin 500 mg twice per day tonight for the next week    Post Anesthesia Home Care Instructions  Activity: Get plenty of rest for the remainder of the day. A responsible adult should stay with you for 24 hours following the procedure.  For the next 24 hours, DO NOT: -Drive a car -Advertising copywriterperate machinery -Drink alcoholic beverages -Take any medication unless instructed by your physician -Make any legal decisions or sign important papers.  Meals: Start with liquid foods such as gelatin or soup. Progress to regular foods as tolerated. Avoid greasy, spicy, heavy foods. If nausea and/or vomiting occur, drink only clear liquids until the nausea and/or vomiting subsides. Call your physician if vomiting continues.  Special Instructions/Symptoms: Your throat may feel dry or sore from the anesthesia or the breathing tube placed in your throat during surgery. If this causes discomfort, gargle with warm salt water. The discomfort should disappear within 24 hours.  If you had a scopolamine patch placed behind your ear for the management of post- operative nausea and/or vomiting:  1. The medication in the patch is effective for 72 hours, after which it should be removed.  Wrap patch in a tissue and discard in the trash. Wash hands thoroughly with soap and water. 2. You may remove the patch earlier than 72 hours if you experience unpleasant side effects which may include dry mouth, dizziness or visual disturbances. 3. Avoid touching the patch. Wash your hands with soap and water after contact with the patch.

## 2016-04-06 NOTE — Anesthesia Postprocedure Evaluation (Signed)
Anesthesia Post Note  Patient: Todd Mahoney  Procedure(s) Performed: Procedure(s) (LRB): BILATERAL TONSILLECTOMY (Bilateral)  Patient location during evaluation: PACU Anesthesia Type: General Level of consciousness: awake, oriented, awake and alert and patient cooperative Pain management: pain level controlled Vital Signs Assessment: post-procedure vital signs reviewed and stable Respiratory status: spontaneous breathing and respiratory function stable Anesthetic complications: no Comments: HBP Rx. Needs to see family MD for Chronic RX.    Last Vitals:  Filed Vitals:   04/06/16 1230 04/06/16 1242  BP: 155/100 152/100  Pulse: 83 82  Temp:  37.1 C  Resp:  16    Last Pain:  Filed Vitals:   04/06/16 1248  PainSc: 3                  Kathyann Spaugh EDWARD

## 2016-04-06 NOTE — Anesthesia Preprocedure Evaluation (Signed)
Anesthesia Evaluation  Patient identified by MRN, date of birth, ID band  Reviewed: Allergy & Precautions, NPO status , Patient's Chart, lab work & pertinent test results  Airway Mallampati: I  TM Distance: >3 FB Neck ROM: Full    Dental   Pulmonary Current Smoker,    Pulmonary exam normal        Cardiovascular Normal cardiovascular exam     Neuro/Psych    GI/Hepatic   Endo/Other    Renal/GU      Musculoskeletal   Abdominal   Peds  Hematology   Anesthesia Other Findings   Reproductive/Obstetrics                             Anesthesia Physical Anesthesia Plan  ASA: I  Anesthesia Plan: General   Post-op Pain Management:    Induction: Intravenous  Airway Management Planned: Oral ETT  Additional Equipment:   Intra-op Plan:   Post-operative Plan: Extubation in OR  Informed Consent: I have reviewed the patients History and Physical, chart, labs and discussed the procedure including the risks, benefits and alternatives for the proposed anesthesia with the patient or authorized representative who has indicated his/her understanding and acceptance.     Plan Discussed with: CRNA, Anesthesiologist and Surgeon  Anesthesia Plan Comments:         Anesthesia Quick Evaluation

## 2016-04-06 NOTE — Brief Op Note (Signed)
04/06/2016  10:02 AM  PATIENT:  Todd Mahoney  25 y.o. male  PRE-OPERATIVE DIAGNOSIS:  CHRONIC TONSILLITIS  POST-OPERATIVE DIAGNOSIS:  chronic tonsillitis  PROCEDURE:  Procedure(s): BILATERAL TONSILLECTOMY (Bilateral)  SURGEON:  Surgeon(s) and Role:    * Drema Halonhristopher E Deronda Christian, MD - Primary  PHYSICIAN ASSISTANT:   ASSISTANTS: none   ANESTHESIA:   general  EBL:  Total I/O In: 1000 [I.V.:1000] Out: -   BLOOD ADMINISTERED:none  DRAINS: none   LOCAL MEDICATIONS USED:  NONE  SPECIMEN:  No Specimen  DISPOSITION OF SPECIMEN:  N/A  COUNTS:  YES  TOURNIQUET:  * No tourniquets in log *  DICTATION: .Other Dictation: Dictation Number [id number]  PLAN OF CARE: Discharge to home after PACU  PATIENT DISPOSITION:  PACU - hemodynamically stable.   Delay start of Pharmacological VTE agent (>24hrs) due to surgical blood loss or risk of bleeding: yes

## 2016-04-06 NOTE — Transfer of Care (Signed)
Immediate Anesthesia Transfer of Care Note  Patient: Todd Mahoney  Procedure(s) Performed: Procedure(s): BILATERAL TONSILLECTOMY (Bilateral)  Patient Location: PACU  Anesthesia Type:General  Level of Consciousness: awake, alert  and oriented  Airway & Oxygen Therapy: Patient Spontanous Breathing and Patient connected to face mask oxygen  Post-op Assessment: Report given to RN and Post -op Vital signs reviewed and stable  Post vital signs: Reviewed and stable  Last Vitals:  Filed Vitals:   04/06/16 0749  BP: 161/94  Pulse: 65  Temp: 37.2 C  Resp: 18    Last Pain:  Filed Vitals:   04/06/16 0750  PainSc: 0-No pain      Patients Stated Pain Goal: 0 (04/06/16 0749)  Complications: No apparent anesthesia complications

## 2016-04-06 NOTE — Anesthesia Procedure Notes (Signed)
Procedure Name: Intubation Date/Time: 04/06/2016 9:24 AM Performed by: Zenia ResidesPAYNE, Khelani Kops D Pre-anesthesia Checklist: Patient identified, Emergency Drugs available, Suction available and Patient being monitored Patient Re-evaluated:Patient Re-evaluated prior to inductionOxygen Delivery Method: Circle System Utilized Preoxygenation: Pre-oxygenation with 100% oxygen Intubation Type: IV induction Ventilation: Mask ventilation without difficulty Laryngoscope Size: Mac and 3 Grade View: Grade II Tube type: Oral Tube size: 7.0 mm Number of attempts: 1 Airway Equipment and Method: Stylet and Oral airway Placement Confirmation: ETT inserted through vocal cords under direct vision,  positive ETCO2 and breath sounds checked- equal and bilateral Secured at: 24 cm Tube secured with: Tape Dental Injury: Teeth and Oropharynx as per pre-operative assessment

## 2016-04-06 NOTE — Interval H&P Note (Signed)
History and Physical Interval Note:  04/06/2016 8:30 AM  Todd Mahoney  has presented today for surgery, with the diagnosis of CHRONIC TONSILLITIS  The various methods of treatment have been discussed with the patient and family. After consideration of risks, benefits and other options for treatment, the patient has consented to  Procedure(s): BILATERAL TONSILLECTOMY (Bilateral) as a surgical intervention .  The patient's history has been reviewed, patient examined, no change in status, stable for surgery.  I have reviewed the patient's chart and labs.  Questions were answered to the patient's satisfaction.     Cathern Tahir

## 2016-04-06 NOTE — Progress Notes (Signed)
Dr, Katrinka BlazingSmith to visit at bedside and ordered BP med again.  Admin. LAbetalol 5 mg IV

## 2016-04-06 NOTE — Progress Notes (Signed)
Pt BP elevated.  CAlled Dr. Katrinka BlazingSmith and notified and order received for Med.  Admin labetalol 10 mg IV as ordered.

## 2016-04-08 ENCOUNTER — Encounter (HOSPITAL_COMMUNITY): Payer: Self-pay | Admitting: *Deleted

## 2016-04-08 ENCOUNTER — Emergency Department (HOSPITAL_COMMUNITY)
Admission: EM | Admit: 2016-04-08 | Discharge: 2016-04-08 | Disposition: A | Discharge status: Home / Self Care | Payer: BLUE CROSS/BLUE SHIELD | Attending: Emergency Medicine | Admitting: Emergency Medicine

## 2016-04-08 DIAGNOSIS — H578 Other specified disorders of eye and adnexa: Secondary | ICD-10-CM | POA: Diagnosis present

## 2016-04-08 DIAGNOSIS — H1132 Conjunctival hemorrhage, left eye: Secondary | ICD-10-CM | POA: Insufficient documentation

## 2016-04-08 DIAGNOSIS — F17210 Nicotine dependence, cigarettes, uncomplicated: Secondary | ICD-10-CM | POA: Diagnosis not present

## 2016-04-08 DIAGNOSIS — J029 Acute pharyngitis, unspecified: Secondary | ICD-10-CM | POA: Diagnosis not present

## 2016-04-08 MED ORDER — ACETAMINOPHEN 160 MG/5ML PO LIQD: 500.0000 mg | Freq: Four times a day (QID) | ORAL | Status: DC | PRN

## 2016-04-08 MED ORDER — IBUPROFEN 100 MG/5ML PO SUSP: 600.0000 mg | Freq: Four times a day (QID) | ORAL | Status: DC | PRN

## 2016-04-08 NOTE — ED Notes (Signed)
Pt had tonsillectomy Fri.  No has blood in both eyes.  Denies pain and denies changes in vision.

## 2016-04-08 NOTE — ED Provider Notes (Signed)
CSN: [id number]     Arrival date & time 04/08/16  0901 History  By signing my name below, I, [name], attest that this documentation has been prepared under the direction and in the presence of non-physician practitioner, Todd Farrier, PA-C. Electronically Signed: [name], Scribe. 04/08/2016. 9:58 AM.   No chief complaint on file.  The history is provided by the patient. No language interpreter was used.   HPI Comments:  Todd Mahoney is a 25 y.o. male who presents to the Emergency Department complaining of redness in both eyes beginning early this morning. Pt normally wears glasses, no contacts.  Pt was concerned because he had a tonsillectomy 3 days ago by [name]; next appointment is in 2 weeks. Pt experienced bleeding in his throat a few days ago, but has experienced no subsequent bleeding. He is still having pain in his throat but has been eating popsicles and ice cream.  He hasn't filled his prescription for Hycet because the liquid version was not in stock. He is waiting for it to come in to the pharmacy.  Pt has taken Children's Ibuprofen and Tylenol with some relief. Denies vomiting, gagging, blurry vision, eye pain, foreign body sensation, nausea, vomiting, or eye trauma.  Past Medical History  Diagnosis Date  . Chronic tonsillitis 03/2016  . Cough 04/03/2016   Past Surgical History  Procedure Laterality Date  . No past surgeries     Family History  Problem Relation Age of Onset  . Diabetes Mother    Social History  Substance Use Topics  . Smoking status: Current Some Day Smoker -- 0.00 packs/day for 4 years    Types: Cigarettes  . Smokeless tobacco: Never Used     Comment: 3-4 cig./week  . Alcohol Use: Yes     Comment: occasionally    Review of Systems  Constitutional: Negative for fever.  HENT: Positive for sore throat. Negative for drooling.   Eyes: Positive for redness. Negative for photophobia, pain, discharge, itching and visual disturbance.   Gastrointestinal: Negative for vomiting.  Skin: Negative for rash.  Neurological: Negative for light-headedness and headaches.    Allergies  Review of patient's allergies indicates no known allergies.  Home Medications   Prior to Admission medications   Medication Sig Start Date End Date Taking? Authorizing Provider  amoxicillin (AMOXIL) 250 MG/5ML suspension Take 10 mLs (500 mg total) by mouth 2 (two) times daily. 04/06/16 04/14/16  [name], MD  HYDROcodone-acetaminophen (HYCET) 7.5-325 mg/15 ml solution Take 10-15 mLs by mouth every 4 (four) hours as needed for moderate pain or severe pain. 04/06/16   [name], MD  ibuprofen (ADVIL,MOTRIN) 600 MG tablet Take 1 tablet (600 mg total) by mouth every 8 (eight) hours as needed for pain. 10/09/12   [name], MD   There were no vitals taken for this visit. Physical Exam  Constitutional: He appears well-developed and well-nourished. No distress.  Nontoxic appearing.  HENT:  Head: Normocephalic and atraumatic.  Right Ear: Tympanic membrane normal.  Left Ear: Tympanic membrane normal.  Postsurgical changes consistent with a tonsillectomy. No active bleeding. No drooling. Throat is clear. Airway intact.   Eyes: EOM are normal. Pupils are equal, round, and reactive to light. Right eye exhibits no discharge. Left eye exhibits no discharge. Left conjunctiva has a hemorrhage. No scleral icterus.  Small subconjunctival hemorrhage noted on his left lower eye. No proptosis. EOMs intact.  Cardiovascular: Normal rate, regular rhythm and intact distal pulses.   Pulmonary/Chest: Effort normal.  No respiratory distress.  Abdominal: Soft. There is no tenderness.  Neurological: He is alert. Coordination normal.  Skin: No rash noted. He is not diaphoretic.  Psychiatric: He has a normal mood and affect. His behavior is normal.  Nursing note and vitals reviewed.   ED Course  Procedures  DIAGNOSTIC STUDIES:  Oxygen  Saturation is 100% on RA, normal by my interpretation.  COORDINATION OF CARE:  9:52 AM Will discharge with prescriptions for liquid Ibuprofen and liquid Tylenol. Discussed treatment plan with pt at bedside and pt agreed to plan.  MDM   Meds given in ED:  Medications - No data to display  Discharge Medication List as of 04/08/2016 10:00 AM    START taking these medications   Details  acetaminophen (TYLENOL) 160 MG/5ML liquid Take 15.6 mLs (500 mg total) by mouth every 6 (six) hours as needed for pain., Starting 04/08/2016, Until Discontinued, Print    ibuprofen (CHILD IBUPROFEN) 100 MG/5ML suspension Take 30 mLs (600 mg total) by mouth every 6 (six) hours as needed for mild pain or moderate pain., Starting 04/08/2016, Until Discontinued, Print        Final diagnoses:  Subconjunctival hemorrhage of left eye    This is a 25 y.o. male who presents to the Emergency Department complaining of redness in both eyes beginning early this morning. Pt normally wears glasses, no contacts.  Pt was concerned because he had a tonsillectomy 3 days ago by [name]; next appointment is in 2 weeks. No foreign body sensation. No eye trauma. No changes to his vision. He does not wear contacts. On exam the patient is afebrile and nontoxic appearing. His vision is grossly intact. He has a small subconjunctival hemorrhage to his left lower eye. No proptosis. EOMs intact. Vision is grossly intact. Throat has evidence of postsurgical changes following tonsillectomy. No active bleeding. I provided reassurance of the subconjunctival hemorrhage and encouraged him to follow-up with ENT this week. The patient is still waiting on Hycet to be filled at his pharmacy. I advised he could use Tylenol and ibuprofen alternating until this is filled. I see cannot use Tylenol when using Hycet, because it contains tylenol. I encouraged him to follow-up with ENT this week. I discussed return precautions. I advised the patient to  follow-up with their primary care provider this week. I advised the patient to return to the emergency department with new or worsening symptoms or new concerns. The patient verbalized understanding and agreement with plan.   I personally performed the services described in this documentation, which was scribed in my presence. The recorded information has been reviewed and is accurate.        Todd FarrierWilliam Alannah Averhart, PA-C 04/08/16 1016  Raeford RazorStephen Kohut, MD 04/08/16 [phone]

## 2016-04-08 NOTE — Discharge Instructions (Signed)
Subconjunctival Hemorrhage °Subconjunctival hemorrhage is bleeding that happens between the white part of your eye (sclera) and the clear membrane that covers the outside of your eye (conjunctiva). There are many tiny blood vessels near the surface of your eye. A subconjunctival hemorrhage happens when one or more of these vessels breaks and bleeds, causing a red patch to appear on your eye. This is similar to a bruise. °Depending on the amount of bleeding, the red patch may only cover a small area of your eye or it may cover the entire visible part of the sclera. If a lot of blood collects under the conjunctiva, there may also be swelling. Subconjunctival hemorrhages do not affect your vision or cause pain, but your eye may feel irritated if there is swelling. Subconjunctival hemorrhages usually do not require treatment, and they disappear on their own within two weeks. °CAUSES °This condition may be caused by: °· Mild trauma, such as rubbing your eye too hard. °· Severe trauma or blunt injuries. °· Coughing, sneezing, or vomiting. °· Straining, such as when lifting a heavy object. °· High blood pressure. °· Recent eye surgery. °· A history of diabetes. °· Certain medicines, especially blood thinners (anticoagulants). °· Other conditions, such as eye tumors, bleeding disorders, or blood vessel abnormalities. °Subconjunctival hemorrhages can happen without an obvious cause.  °SYMPTOMS  °Symptoms of this condition include: °· A bright red or dark red patch on the white part of the eye. °¨ The red area may spread out to cover a larger area of the eye before it goes away. °¨ The red area may turn brownish-yellow before it goes away. °· Swelling. °· Mild eye irritation. °DIAGNOSIS °This condition is diagnosed with a physical exam. If your subconjunctival hemorrhage was caused by trauma, your health care provider may refer you to an eye specialist (ophthalmologist) or another specialist to check for other injuries. You  may have other tests, including: °· An eye exam. °· A blood pressure check. °· Blood tests to check for bleeding disorders. °If your subconjunctival hemorrhage was caused by trauma, X-rays or a CT scan may be done to check for other injuries. °TREATMENT °Usually, no treatment is needed. Your health care provider may recommend eye drops or cold compresses to help with discomfort. °HOME CARE INSTRUCTIONS °· Take over-the-counter and prescription medicines only as directed by your health care provider. °· Use eye drops or cold compresses to help with discomfort as directed by your health care provider. °· Avoid activities, things, and environments that may irritate or injure your eye. °· Keep all follow-up visits as told by your health care provider. This is important. °SEEK MEDICAL CARE IF: °· You have pain in your eye. °· The bleeding does not go away within 3 weeks. °· You keep getting new subconjunctival hemorrhages. °SEEK IMMEDIATE MEDICAL CARE IF: °· Your vision changes or you have difficulty seeing. °· You suddenly develop severe sensitivity to light. °· You develop a severe headache, persistent vomiting, confusion, or abnormal tiredness (lethargy). °· Your eye seems to bulge or protrude from your eye socket. °· You develop unexplained bruises on your body. °· You have unexplained bleeding in another area of your body. °  °This information is not intended to replace advice given to you by your health care provider. Make sure you discuss any questions you have with your health care provider. °  °Document Released: 11/12/2005 Document Revised: 08/03/2015 Document Reviewed: 01/19/2015 °Elsevier Interactive Patient Education ©2016 [employer] ° °

## 2016-04-09 ENCOUNTER — Encounter (HOSPITAL_BASED_OUTPATIENT_CLINIC_OR_DEPARTMENT_OTHER): Payer: Self-pay | Admitting: Otolaryngology

## 2016-04-09 NOTE — Op Note (Signed)
NAME:  Todd Mahoney, Todd Mahoney                    ACCOUNT NO.:  MEDICAL RECORD NO.:  [id number]  LOCATION:                                 FACILITY:  PHYSICIAN:  Kristine GarbeChristopher E. Ezzard StandingNewman, M.D. 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  OPERATION PERFORMED:  Tonsillectomy.  SURGEON:  Kristine GarbeChristopher E. Ezzard StandingNewman, M.D.  ANESTHESIA:  General endotracheal.  COMPLICATIONS:  None.  BRIEF CLINICAL NOTE:  Todd NewnessOmar Mahoney is a 25 year old [employer] student who has had frequent sore throats.  He has had several rounds of antibiotics.  On exam, he has large 3+ tonsils with exudates on both tonsils that not respond well to antibiotic therapy.  He was taken to the operating room at this time for tonsillectomy.  DESCRIPTION OF PROCEDURE:  After adequate endotracheal anesthesia, the patient received 10 mg of Decadron and 2 g of Ancef IV preoperatively. A mouthgag was used to expose the oropharynx.  Of note, he had a fairly small mouth with a very large 3+ kissing tonsils.  The tonsils were dissected from the tonsillar fossa using a cautery.  Hemostasis was obtained with a cautery.  After obtaining adequate hemostasis, the oropharynx was irrigated with saline.  There were no significant bleeding problems.  Nasopharynx was examined.  He had some diffuse large adenoid lymphoid tissue in the nasopharynx and nothing was done in this region.  No acute exudate.  Following removal of the tonsils, he had very edematous, elongated uvula and the lower one-third of the edematous uvula was excised with cautery.  This completed the procedure.  Todd Mahoney was awoken from anesthesia and transferred to the recovery room, postop doing well.  DISPOSITION:  Todd Mahoney was discharged home later  this morning on Tylenol, Motrin or hydrocodone elixir 10-15 mL q.4 hours p.r.n. pain.  We will also start him on amoxicillin 500 mg b.i.d. for a week.  We will have him followup in my office in 10-14 days for recheck.         ______________________________ Kristine Garbehristopher E. Ezzard StandingNewman, M.D.    CEN/MEDQ  D:  04/06/2016  T:  04/07/2016  Job:  [id number]

## 2016-12-12 ENCOUNTER — Encounter (HOSPITAL_COMMUNITY): Payer: Self-pay | Admitting: *Deleted

## 2016-12-12 ENCOUNTER — Emergency Department (HOSPITAL_COMMUNITY)
Admission: EM | Admit: 2016-12-12 | Discharge: 2016-12-12 | Disposition: A | Discharge status: Home / Self Care | Payer: BLUE CROSS/BLUE SHIELD | Attending: Emergency Medicine | Admitting: Emergency Medicine

## 2016-12-12 ENCOUNTER — Emergency Department (HOSPITAL_COMMUNITY): Payer: BLUE CROSS/BLUE SHIELD

## 2016-12-12 DIAGNOSIS — R079 Chest pain, unspecified: Secondary | ICD-10-CM

## 2016-12-12 DIAGNOSIS — J189 Pneumonia, unspecified organism: Secondary | ICD-10-CM

## 2016-12-12 DIAGNOSIS — F17210 Nicotine dependence, cigarettes, uncomplicated: Secondary | ICD-10-CM | POA: Insufficient documentation

## 2016-12-12 LAB — COMPREHENSIVE METABOLIC PANEL
ALBUMIN: 3.6 g/dL (ref 3.5–5.0)
ALT: 51 U/L (ref 17–63)
ANION GAP: 10 (ref 5–15)
AST: 35 U/L (ref 15–41)
Alkaline Phosphatase: 60 U/L (ref 38–126)
BILIRUBIN TOTAL: 0.9 mg/dL (ref 0.3–1.2)
BUN: 6 mg/dL (ref 6–20)
CHLORIDE: 105 mmol/L (ref 101–111)
CO2: 21 mmol/L — ABNORMAL LOW (ref 22–32)
Calcium: 8.8 mg/dL — ABNORMAL LOW (ref 8.9–10.3)
Creatinine, Ser: 1.14 mg/dL (ref 0.61–1.24)
GFR calc Af Amer: >60 mL/min (ref >60)
Glucose, Bld: 114 mg/dL — ABNORMAL HIGH (ref 65–99)
Potassium: 3 mmol/L — ABNORMAL LOW (ref 3.5–5.1)
Sodium: 136 mmol/L (ref 135–145)
TOTAL PROTEIN: 6.6 g/dL (ref 6.5–8.1)

## 2016-12-12 LAB — CBC WITH DIFFERENTIAL/PLATELET
BASOS ABS: 0 10*3/uL (ref 0.0–0.1)
Basophils Relative: 0 %
Eosinophils Absolute: 0.1 10*3/uL (ref 0.0–0.7)
Eosinophils Relative: 1 %
HEMATOCRIT: 41.8 % (ref 39.0–52.0)
Hemoglobin: 14.9 g/dL (ref 13.0–17.0)
LYMPHS ABS: 0.6 10*3/uL — AB (ref 0.7–4.0)
LYMPHS PCT: 8 %
MCH: 30.7 pg (ref 26.0–34.0)
MCHC: 35.6 g/dL (ref 30.0–36.0)
MCV: 86.2 fL (ref 78.0–100.0)
Monocytes Absolute: 0.5 10*3/uL (ref 0.1–1.0)
Monocytes Relative: 7 %
Neutro Abs: 6.2 10*3/uL (ref 1.7–7.7)
Neutrophils Relative %: 84 %
Platelets: 190 10*3/uL (ref 150–400)
RBC: 4.85 MIL/uL (ref 4.22–5.81)
RDW: 12.4 % (ref 11.5–15.5)
WBC: 7.4 10*3/uL (ref 4.0–10.5)

## 2016-12-12 LAB — I-STAT TROPONIN, ED: Troponin i, poc: 0 ng/mL (ref 0.00–0.08)

## 2016-12-12 LAB — I-STAT CG4 LACTIC ACID, ED: LACTIC ACID, VENOUS: 0.97 mmol/L (ref 0.5–1.9)

## 2016-12-12 MED ORDER — ACETAMINOPHEN 500 MG PO TABS
1000.0000 mg | ORAL_TABLET | Freq: Once | ORAL | Status: AC
  Administered 2016-12-12: 1000 mg via ORAL
  Filled 2016-12-12: qty 2

## 2016-12-12 MED ORDER — DOXYCYCLINE HYCLATE 100 MG PO CAPS: 100.0000 mg | ORAL_CAPSULE | Freq: Two times a day (BID) | ORAL | 0 refills | Status: DC

## 2016-12-12 MED ORDER — SODIUM CHLORIDE 0.9 % IV BOLUS (SEPSIS)
1000.0000 mL | Freq: Once | INTRAVENOUS | Status: AC
  Administered 2016-12-12: 1000 mL via INTRAVENOUS

## 2016-12-12 MED ORDER — POTASSIUM CHLORIDE CRYS ER 20 MEQ PO TBCR
40.0000 meq | EXTENDED_RELEASE_TABLET | Freq: Once | ORAL | Status: AC
  Administered 2016-12-12: 40 meq via ORAL
  Filled 2016-12-12: qty 2

## 2016-12-12 MED ORDER — DOXYCYCLINE HYCLATE 100 MG PO TABS
100.0000 mg | ORAL_TABLET | Freq: Once | ORAL | Status: AC
  Administered 2016-12-12: 100 mg via ORAL
  Filled 2016-12-12: qty 1

## 2016-12-12 NOTE — ED Provider Notes (Signed)
[facility]-EMERGENCY DEPT Provider Note   CSN: [id number] Arrival date & time: 12/12/16 0847     History    Chief Complaint  Patient presents with  . Chest Pain     HPI Todd Mahoney is a 26 y.o. male.  26yo M who p/w chest pain, cough, and congestion. Patient reports that 2 days ago he began having central, constant chest pain and a few hours later developed a cough that has been productive of yellow-green phlegm. He has had associated fever, chills, and shortness of breath with temp up to 102.4 at home. His chest pain is worse with certain smells. He reports associated congestion. His roommate is currently ill with similar symptoms. He denies any vomiting, diarrhea, or abdominal pain. He took ibuprofen earlier this morning for his symptoms. No recent travel. No skin rash.   Past Medical History:  Diagnosis Date  . Chronic tonsillitis 03/2016  . Cough 04/03/2016     Patient Active Problem List   Diagnosis Date Noted  . Tobacco user 03/19/2012  . Allergic rhinitis, seasonal 03/19/2012  . Health care maintenance 01/15/2012    Past Surgical History:  Procedure Laterality Date  . NO PAST SURGERIES    . TONSILLECTOMY Bilateral 04/06/2016   Procedure: BILATERAL TONSILLECTOMY;  Surgeon: [name], MD;  Location: [facility];  Service: ENT;  Laterality: Bilateral;        Home Medications    Prior to Admission medications   Medication Sig Start Date End Date Taking? Authorizing Provider  acetaminophen (TYLENOL) 160 MG/5ML liquid Take 15.6 mLs (500 mg total) by mouth every 6 (six) hours as needed for pain. Patient not taking: Reported on 12/12/2016 04/08/16   Everlene Farrier, PA-C  doxycycline (VIBRAMYCIN) 100 MG capsule Take 1 capsule (100 mg total) by mouth 2 (two) times daily. 12/12/16   Laurence Spates, MD  HYDROcodone-acetaminophen (HYCET) 7.5-325 mg/15 ml solution Take 10-15 mLs by mouth every 4 (four) hours as needed for moderate pain or severe  pain. Patient not taking: Reported on 12/12/2016 04/06/16   [name], MD  ibuprofen (CHILD IBUPROFEN) 100 MG/5ML suspension Take 30 mLs (600 mg total) by mouth every 6 (six) hours as needed for mild pain or moderate pain. Patient not taking: Reported on 12/12/2016 04/08/16   Everlene Farrier, PA-C      Family History  Problem Relation Age of Onset  . Diabetes Mother      Social History  Substance Use Topics  . Smoking status: Current Some Day Smoker    Packs/day: 0.00    Years: 4.00    Types: Cigarettes  . Smokeless tobacco: Never Used     Comment: 3-4 cig./week  . Alcohol use Yes     Comment: occasionally     Allergies     Patient has no known allergies.    Review of Systems  10 Systems reviewed and are negative for acute change except as noted in the HPI.   Physical Exam Updated Vital Signs BP 131/70 (BP Location: Left Arm) Comment: Simultaneous filing. User may not have seen previous data.  Pulse 102 Comment: Simultaneous filing. User may not have seen previous data.  Temp 99.1 F (37.3 C) (Oral)   Resp 18   SpO2 96% Comment: Simultaneous filing. User may not have seen previous data.  Physical Exam  Constitutional: He is oriented to person, place, and time. He appears well-developed and well-nourished. No distress.  Uncomfortable  HENT:  Head: Normocephalic and atraumatic.  Mouth/Throat:  Oropharynx is clear and moist.  Moist mucous membranes  Eyes: Conjunctivae are normal. Pupils are equal, round, and reactive to light.  Neck: Neck supple.  Cardiovascular: Regular rhythm and normal heart sounds.  Tachycardia present.   No murmur heard. Pulmonary/Chest: Effort normal.  Rhonchi b/l  Abdominal: Soft. Bowel sounds are normal. He exhibits no distension. There is no tenderness.  Musculoskeletal: He exhibits no edema.  Neurological: He is alert and oriented to person, place, and time.  Fluent speech  Skin: Skin is warm and dry. No rash noted.    Psychiatric: He has a normal mood and affect. Judgment normal.  Nursing note and vitals reviewed.     ED Treatments / Results  Labs (all labs ordered are listed, but only abnormal results are displayed) Labs Reviewed  COMPREHENSIVE METABOLIC PANEL - Abnormal; Notable for the following:       Result Value   Potassium 3.0 (*)    CO2 21 (*)    Glucose, Bld 114 (*)    Calcium 8.8 (*)    All other components within normal limits  CBC WITH DIFFERENTIAL/PLATELET - Abnormal; Notable for the following:    Lymphs Abs 0.6 (*)    All other components within normal limits  I-STAT TROPOININ, ED  I-STAT CG4 LACTIC ACID, ED     EKG  EKG Interpretation  Date/Time:  Wednesday December 12 2016 08:54:43 EST Ventricular Rate:  126 PR Interval:  118 QRS Duration: 84 QT Interval:  306 QTC Calculation: 443 R Axis:   50 Text Interpretation:  Sinus tachycardia Otherwise normal ECG No previous ECGs available Confirmed by Boston Cookson MD, Daelin Haste [phone]) on 12/12/2016 8:56:15 AM         Radiology Dg Chest 2 View  Result Date: 12/12/2016 CLINICAL DATA:  Chest pain, cough, fever EXAM: CHEST  2 VIEW COMPARISON:  None. FINDINGS: Cardiomediastinal silhouette is stable. No pulmonary edema. There is streaky left base retrocardiac atelectasis or early infiltrate. Bony thorax is unremarkable. IMPRESSION: Streaky left base retrocardiac atelectasis or early infiltrate. No pulmonary edema. Electronically Signed   By: Natasha Mead M.D.   On: 12/12/2016 09:17    Procedures Procedures (including critical care time) Procedures  Medications Ordered in ED  Medications  acetaminophen (TYLENOL) tablet 1,000 mg (1,000 mg Oral Given 12/12/16 0932)  sodium chloride 0.9 % bolus 1,000 mL (0 mLs Intravenous Stopped 12/12/16 1034)  doxycycline (VIBRA-TABS) tablet 100 mg (100 mg Oral Given 12/12/16 1033)  potassium chloride SA (K-DUR,KLOR-CON) CR tablet 40 mEq (40 mEq Oral Given 12/12/16 1155)     Initial Impression /  Assessment and Plan / ED Course  I have reviewed the triage vital signs and the nursing notes.  Pertinent labs & imaging results that were available during my care of the patient were reviewed by me and considered in my medical decision making (see chart for details).  Clinical Course     Patient with 2 days of cough associated with chest pain, fevers, shortness of breath, nasal congestion. He was nontoxic on exam. Vital signs notable for fever of 102.6. BP normal and O2 saturation 96-98% on room air. He had normal work of breathing, no wheezing or respiratory distress. Labwork shows normal troponin, potassium 3.04 which I gave him oral repletion, normal CBC and normal lactate. Chest x-ray shows possible early pneumonia and left lung base. Given his fever, we will treat for community-acquired pneumonia with doxycycline, first dose given here. After receiving Tylenol and an IV fluid bolus, the patient was resting  topically on reexamination and stated that he felt better. His fever had improved and he continued to have normal work of breathing. Discussed outpatient treatment with antibiotics and extensively reviewed return precautions including any worsening symptoms such as respiratory distress or severe chest pain. The patient has no family history of early heart disease and no major risk factors for ACS thus I suspect this chest pain is related to infectious symptoms. Patient discharged in satisfactory condition.  Final Clinical Impressions(s) / ED Diagnoses   Final diagnoses:  Community acquired pneumonia, unspecified laterality  Chest pain, unspecified type     Discharge Medication List as of 12/12/2016 11:49 AM    START taking these medications   Details  doxycycline (VIBRAMYCIN) 100 MG capsule Take 1 capsule (100 mg total) by mouth 2 (two) times daily., Starting Wed 12/12/2016, Print           Laurence Spatesachel Morgan Nylene Inlow, MD 12/12/16 [phone]

## 2016-12-12 NOTE — ED Notes (Signed)
ED Provider at bedside. 

## 2016-12-12 NOTE — ED Triage Notes (Signed)
Pt repots chest pain, cough, congestion for 2 days.reports that the chest pain started prior to cough. Pt reports chills and nausea.

## 2017-06-01 ENCOUNTER — Other Ambulatory Visit: Payer: Self-pay

## 2017-06-01 ENCOUNTER — Encounter (HOSPITAL_COMMUNITY): Payer: Self-pay

## 2017-06-01 ENCOUNTER — Emergency Department (HOSPITAL_COMMUNITY)
Admission: EM | Admit: 2017-06-01 | Discharge: 2017-06-01 | Disposition: A | Discharge status: Home / Self Care | Payer: BLUE CROSS/BLUE SHIELD | Attending: Emergency Medicine | Admitting: Emergency Medicine

## 2017-06-01 ENCOUNTER — Emergency Department (HOSPITAL_COMMUNITY): Payer: BLUE CROSS/BLUE SHIELD

## 2017-06-01 ENCOUNTER — Other Ambulatory Visit (HOSPITAL_COMMUNITY): Payer: Self-pay

## 2017-06-01 DIAGNOSIS — R0789 Other chest pain: Secondary | ICD-10-CM | POA: Diagnosis present

## 2017-06-01 DIAGNOSIS — F17210 Nicotine dependence, cigarettes, uncomplicated: Secondary | ICD-10-CM | POA: Insufficient documentation

## 2017-06-01 DIAGNOSIS — I300 Acute nonspecific idiopathic pericarditis: Secondary | ICD-10-CM | POA: Insufficient documentation

## 2017-06-01 LAB — CBC WITH DIFFERENTIAL/PLATELET
Basophils Absolute: 0 10*3/uL (ref 0.0–0.1)
Basophils Relative: 1 %
EOS ABS: 0.3 10*3/uL (ref 0.0–0.7)
EOS PCT: 7 %
HCT: 42.7 % (ref 39.0–52.0)
Hemoglobin: 15.2 g/dL (ref 13.0–17.0)
LYMPHS ABS: 1.7 10*3/uL (ref 0.7–4.0)
Lymphocytes Relative: 34 %
MCH: 30.7 pg (ref 26.0–34.0)
MCHC: 35.6 g/dL (ref 30.0–36.0)
MCV: 86.3 fL (ref 78.0–100.0)
MONO ABS: 0.7 10*3/uL (ref 0.1–1.0)
Monocytes Relative: 13 %
Neutro Abs: 2.3 10*3/uL (ref 1.7–7.7)
Neutrophils Relative %: 45 %
PLATELETS: 198 10*3/uL (ref 150–400)
RBC: 4.95 MIL/uL (ref 4.22–5.81)
RDW: 12.3 % (ref 11.5–15.5)
WBC: 4.9 10*3/uL (ref 4.0–10.5)

## 2017-06-01 LAB — POCT I-STAT TROPONIN I: TROPONIN I, POC: 0.01 ng/mL (ref 0.00–0.08)

## 2017-06-01 LAB — COMPREHENSIVE METABOLIC PANEL
ALT: 41 U/L (ref 17–63)
ANION GAP: 6 (ref 5–15)
AST: 31 U/L (ref 15–41)
Albumin: 4.1 g/dL (ref 3.5–5.0)
Alkaline Phosphatase: 74 U/L (ref 38–126)
BUN: 10 mg/dL (ref 6–20)
CHLORIDE: 106 mmol/L (ref 101–111)
CO2: 28 mmol/L (ref 22–32)
Calcium: 9.2 mg/dL (ref 8.9–10.3)
Creatinine, Ser: 1.1 mg/dL (ref 0.61–1.24)
Glucose, Bld: 103 mg/dL — ABNORMAL HIGH (ref 65–99)
POTASSIUM: 3.7 mmol/L (ref 3.5–5.1)
SODIUM: 140 mmol/L (ref 135–145)
Total Bilirubin: 0.5 mg/dL (ref 0.3–1.2)
Total Protein: 7.8 g/dL (ref 6.5–8.1)

## 2017-06-01 LAB — CK: CK TOTAL: 149 U/L (ref 49–397)

## 2017-06-01 MED ORDER — KETOROLAC TROMETHAMINE 60 MG/2ML IM SOLN
60.0000 mg | Freq: Once | INTRAMUSCULAR | Status: AC
  Administered 2017-06-01: 60 mg via INTRAMUSCULAR
  Filled 2017-06-01: qty 2

## 2017-06-01 MED ORDER — INDOMETHACIN 50 MG PO CAPS: 50.0000 mg | ORAL_CAPSULE | Freq: Three times a day (TID) | ORAL | 0 refills | Status: DC

## 2017-06-01 MED ORDER — COLCHICINE 0.6 MG PO TABS: 0.6000 mg | ORAL_TABLET | Freq: Two times a day (BID) | ORAL | 0 refills | Status: DC

## 2017-06-01 NOTE — ED Provider Notes (Signed)
[facility]-EMERGENCY DEPT Provider Note   CSN: [id number] Arrival date & time: 06/01/17  1748     History   Chief Complaint Chief Complaint  Patient presents with  . Cough  . Chest Pain    HPI Todd Mahoney is a 26 y.o. male.  HPI Patient presents with 6 days of nasal congestion and sinus pressure. He's had thick green nasal drainage. He developed diffuse thoracic and chest pain 2 days ago. Describes the pain as sharp. Worse with movement and deep breathing. He's had mild shortness of breath and nonproductive cough. Denies any fever or chills. No new lower extremity swelling or pain. No recent extended travel or immobilization. No recent trauma or heavy lifting. Past Medical History:  Diagnosis Date  . Chronic tonsillitis 03/2016  . Cough 04/03/2016    Patient Active Problem List   Diagnosis Date Noted  . Tobacco user 03/19/2012  . Allergic rhinitis, seasonal 03/19/2012  . Health care maintenance 01/15/2012    Past Surgical History:  Procedure Laterality Date  . NO PAST SURGERIES    . TONSILLECTOMY Bilateral 04/06/2016   Procedure: BILATERAL TONSILLECTOMY;  Surgeon: [name], MD;  Location: [facility];  Service: ENT;  Laterality: Bilateral;       Home Medications    Prior to Admission medications   Medication Sig Start Date End Date Taking? Authorizing Provider  cetirizine (ZYRTEC) 10 MG tablet Take 10 mg by mouth daily.   Yes [provider]  loratadine (CLARITIN) 10 MG tablet Take 10 mg by mouth daily.   Yes [provider]  acetaminophen (TYLENOL) 160 MG/5ML liquid Take 15.6 mLs (500 mg total) by mouth every 6 (six) hours as needed for pain. Patient not taking: Reported on 12/12/2016 04/08/16   Everlene Farrier, PA-C  colchicine 0.6 MG tablet Take 1 tablet (0.6 mg total) by mouth 2 (two) times daily. 06/01/17   Loren Racer, MD  doxycycline (VIBRAMYCIN) 100 MG capsule Take 1 capsule (100 mg total) by mouth 2 (two) times  daily. Patient not taking: Reported on 06/01/2017 12/12/16   Little, Ambrose Finland, MD  HYDROcodone-acetaminophen (HYCET) 7.5-325 mg/15 ml solution Take 10-15 mLs by mouth every 4 (four) hours as needed for moderate pain or severe pain. Patient not taking: Reported on 12/12/2016 04/06/16   [name], MD  ibuprofen (CHILD IBUPROFEN) 100 MG/5ML suspension Take 30 mLs (600 mg total) by mouth every 6 (six) hours as needed for mild pain or moderate pain. Patient not taking: Reported on 12/12/2016 04/08/16   Everlene Farrier, PA-C  indomethacin (INDOCIN) 50 MG capsule Take 1 capsule (50 mg total) by mouth 3 (three) times daily after meals. 06/01/17   Loren Racer, MD    Family History Family History  Problem Relation Age of Onset  . Diabetes Mother     Social History Social History  Substance Use Topics  . Smoking status: Current Some Day Smoker    Packs/day: 0.00    Years: 4.00    Types: Cigarettes  . Smokeless tobacco: Never Used     Comment: 3-4 cig./week  . Alcohol use Yes     Comment: occasionally     Allergies   Patient has no known allergies.   Review of Systems Review of Systems  Constitutional: Negative for chills and fever.  HENT: Positive for congestion and sinus pressure. Negative for sore throat, trouble swallowing and voice change.   Respiratory: Positive for cough and shortness of breath. Negative for wheezing.   Cardiovascular: Positive  for chest pain. Negative for palpitations and leg swelling.  Gastrointestinal: Negative for abdominal pain, constipation, diarrhea, nausea and vomiting.  Genitourinary: Negative for dysuria, flank pain, frequency and hematuria.  Musculoskeletal: Positive for back pain and myalgias. Negative for arthralgias, neck pain and neck stiffness.  Skin: Negative for rash and wound.  Neurological: Negative for dizziness, weakness, light-headedness, numbness and headaches.  All other systems reviewed and are negative.    Physical  Exam Updated Vital Signs BP (!) 144/96   Pulse 71   Temp 98.6 F (37 C) (Oral)   Resp 16   SpO2 100%   Physical Exam  Constitutional: He is oriented to person, place, and time. He appears well-developed and well-nourished. No distress.  HENT:  Head: Normocephalic and atraumatic.  Mouth/Throat: Oropharynx is clear and moist. No oropharyngeal exudate.  Bilateral left greater than right nasal mucosal edema. No sinus tenderness to percussion. Oropharynx is mildly erythematous.  Eyes: EOM are normal. Pupils are equal, round, and reactive to light. Right eye exhibits no discharge. Left eye exhibits no discharge.  Neck: Normal range of motion. Neck supple.  No posterior cervical midline tenderness.  Cardiovascular: Normal rate and regular rhythm.  Exam reveals no gallop and no friction rub.   No murmur heard. Pulmonary/Chest: Effort normal and breath sounds normal. No respiratory distress. He has no wheezes. He has no rales. He exhibits tenderness.  Diffuse bilateral chest tenderness without crepitance or deformity.  Abdominal: Soft. Bowel sounds are normal. There is no tenderness. There is no rebound and no guarding.  Musculoskeletal: Normal range of motion. He exhibits no edema or tenderness.  Patient with left greater than right thoracic back pain mostly along the medial border of the left scapula. No deformity. No midline thoracic or lumbar tenderness. No lower extremity swelling, asymmetry or tenderness. Distal pulses are 2+.  Lymphadenopathy:    He has no cervical adenopathy.  Neurological: He is alert and oriented to person, place, and time.  Moving all extremities without focal deficit. Sensation fully intact.  Skin: Skin is warm and dry. Capillary refill takes less than 2 seconds. No rash noted. He is not diaphoretic. No erythema.  Psychiatric: He has a normal mood and affect. His behavior is normal.  Nursing note and vitals reviewed.    ED Treatments / Results  Labs (all labs  ordered are listed, but only abnormal results are displayed) Labs Reviewed  COMPREHENSIVE METABOLIC PANEL - Abnormal; Notable for the following:       Result Value   Glucose, Bld 103 (*)    All other components within normal limits  CBC WITH DIFFERENTIAL/PLATELET  CK  C-REACTIVE PROTEIN  I-STAT TROPOININ, ED  POCT I-STAT TROPONIN I    EKG  EKG Interpretation None       Radiology Dg Chest 2 View  Result Date: 06/01/2017 CLINICAL DATA:  Cough. EXAM: CHEST  2 VIEW COMPARISON:  None. FINDINGS: The heart size and mediastinal contours are within normal limits. Both lungs are clear. The visualized skeletal structures are unremarkable. IMPRESSION: No active cardiopulmonary disease. Electronically Signed   By: Mitzi Hansen M.D.   On: 06/01/2017 18:12    Procedures Procedures (including critical care time)  Medications Ordered in ED Medications  ketorolac (TORADOL) injection 60 mg (60 mg Intramuscular Given 06/01/17 1900)     Initial Impression / Assessment and Plan / ED Course  I have reviewed the triage vital signs and the nursing notes.  Pertinent labs & imaging results that were available during  my care of the patient were reviewed by me and considered in my medical decision making (see chart for details).     Patient with new diffuse ST segment elevation. Patient states his pain is worse with lying flat and better with lying forward. No rub appreciated. Discussed with cardiology on call. Recommend colchicine 0.6 mg twice a day and indomethacin 50 mg 3 times a day with meals. Patient is to follow-up in 2 weeks with the cardiologist. Bedside ultrasound with trace pericardial effusion but no evidence of tampanade. Return precautions have been given.  Final Clinical Impressions(s) / ED Diagnoses   Final diagnoses:  Acute idiopathic pericarditis    New Prescriptions New Prescriptions   COLCHICINE 0.6 MG TABLET    Take 1 tablet (0.6 mg total) by mouth 2 (two) times  daily.   INDOMETHACIN (INDOCIN) 50 MG CAPSULE    Take 1 capsule (50 mg total) by mouth 3 (three) times daily after meals.     Loren RacerYelverton, Zaydah Nawabi, MD 06/01/17 2230

## 2017-06-01 NOTE — ED Triage Notes (Signed)
Pt states cough/cold-like symptoms since Monday.  Chest pain with movement and increased respirations.

## 2017-06-02 LAB — C-REACTIVE PROTEIN: CRP: <0.8 mg/dL (ref <1.0)

## 2017-08-15 ENCOUNTER — Encounter (HOSPITAL_COMMUNITY): Payer: Self-pay | Admitting: *Deleted

## 2017-08-15 ENCOUNTER — Ambulatory Visit (HOSPITAL_COMMUNITY)
Admission: EM | Admit: 2017-08-15 | Discharge: 2017-08-15 | Disposition: A | Discharge status: Home / Self Care | Payer: BLUE CROSS/BLUE SHIELD | Attending: Family Medicine | Admitting: Family Medicine

## 2017-08-15 DIAGNOSIS — K529 Noninfective gastroenteritis and colitis, unspecified: Secondary | ICD-10-CM | POA: Diagnosis not present

## 2017-08-15 MED ORDER — DICYCLOMINE HCL 20 MG PO TABS: 20.0000 mg | ORAL_TABLET | Freq: Two times a day (BID) | ORAL | 0 refills | Status: DC

## 2017-08-15 MED ORDER — ONDANSETRON 8 MG PO TBDP: 8.0000 mg | ORAL_TABLET | Freq: Three times a day (TID) | ORAL | 0 refills | Status: DC | PRN

## 2017-08-15 NOTE — ED Provider Notes (Signed)
[facility]   [id number] 08/15/17 Arrival Time: 1323   SUBJECTIVE:  Todd Mahoney is a 26 y.o. male who presents to the urgent care with complaint of diarrhea with abdominal cramping for the past 24 hours.  There has been no vomiting or blood in urine. Temp yesterday was around 100     Past Medical History:  Diagnosis Date  . Chronic tonsillitis 03/2016  . Cough 04/03/2016   Family History  Problem Relation Age of Onset  . Diabetes Mother    Social History   Social History  . Marital status: Single    Spouse name: N/A  . Number of children: N/A  . Years of education: N/A   Occupational History  . student    Social History Main Topics  . Smoking status: Current Some Day Smoker    Packs/day: 0.00    Years: 4.00    Types: Cigarettes  . Smokeless tobacco: Never Used     Comment: 3-4 cig./week  . Alcohol use Yes     Comment: occasionally  . Drug use: No  . Sexual activity: Yes   Other Topics Concern  . Not on file   Social History Narrative  . No narrative on file   No outpatient prescriptions have been marked as taking for the 08/15/17 encounter [facility] Encounter).   No Known Allergies    ROS: As per HPI, remainder of ROS negative.   OBJECTIVE:   Vitals:   08/15/17 1402  BP: (!) 140/91  Pulse: 88  Resp: 18  Temp: 98.6 F (37 C)  TempSrc: Oral  SpO2: 100%     General appearance: alert; no distress Eyes: PERRL; EOMI; conjunctiva normal HENT: normocephalic; atraumatic; , external ears normal without trauma; nasal mucosa normal; oral mucosa normal Neck: supple Lungs: clear to auscultation bilaterally Heart: regular rate and rhythm Abdomen: soft, diffuse mild tenderness; bowel sounds normal; no masses or organomegaly; no guarding or rebound tenderness Back: no CVA tenderness Extremities: no cyanosis or edema; symmetrical with no gross deformities Skin: warm and dry Neurologic: normal gait; grossly normal Psychological: alert and  cooperative; normal mood and affect      Labs:  Results for orders placed or performed during the hospital encounter of 06/01/17  CBC with Differential/Platelet  Result Value Ref Range   WBC 4.9 4.0 - 10.5 K/uL   RBC 4.95 4.22 - 5.81 MIL/uL   Hemoglobin 15.2 13.0 - 17.0 g/dL   HCT 62.1 30.8 - 65.7 %   MCV 86.3 78.0 - 100.0 fL   MCH 30.7 26.0 - 34.0 pg   MCHC 35.6 30.0 - 36.0 g/dL   RDW 84.6 96.2 - 95.2 %   Platelets 198 150 - 400 K/uL   Neutrophils Relative % 45 %   Neutro Abs 2.3 1.7 - 7.7 K/uL   Lymphocytes Relative 34 %   Lymphs Abs 1.7 0.7 - 4.0 K/uL   Monocytes Relative 13 %   Monocytes Absolute 0.7 0.1 - 1.0 K/uL   Eosinophils Relative 7 %   Eosinophils Absolute 0.3 0.0 - 0.7 K/uL   Basophils Relative 1 %   Basophils Absolute 0.0 0.0 - 0.1 K/uL  Comprehensive metabolic panel  Result Value Ref Range   Sodium 140 135 - 145 mmol/L   Potassium 3.7 3.5 - 5.1 mmol/L   Chloride 106 101 - 111 mmol/L   CO2 28 22 - 32 mmol/L   Glucose, Bld 103 (H) 65 - 99 mg/dL   BUN 10 6 - 20 mg/dL  Creatinine, Ser 1.10 0.61 - 1.24 mg/dL   Calcium 9.2 8.9 - 09.8 mg/dL   Total Protein 7.8 6.5 - 8.1 g/dL   Albumin 4.1 3.5 - 5.0 g/dL   AST 31 15 - 41 U/L   ALT 41 17 - 63 U/L   Alkaline Phosphatase 74 38 - 126 U/L   Total Bilirubin 0.5 0.3 - 1.2 mg/dL   GFR calc non Af Amer >60 >60 mL/min   GFR calc Af Amer >60 >60 mL/min   Anion gap 6 5 - 15  CK  Result Value Ref Range   Total CK 149 49 - 397 U/L  C-reactive protein  Result Value Ref Range   CRP <0.8 <1.0 mg/dL  POCT i-Stat troponin I  Result Value Ref Range   Troponin i, poc 0.01 0.00 - 0.08 ng/mL   Comment 3            Labs Reviewed - No data to display  No results found.     ASSESSMENT & PLAN:  1. Gastroenteritis     Meds ordered this encounter  Medications  . dicyclomine (BENTYL) 20 MG tablet    Sig: Take 1 tablet (20 mg total) by mouth 2 (two) times daily.    Dispense:  20 tablet    Refill:  0  .  ondansetron (ZOFRAN-ODT) 8 MG disintegrating tablet    Sig: Take 1 tablet (8 mg total) by mouth every 8 (eight) hours as needed for nausea.    Dispense:  12 tablet    Refill:  0    Reviewed expectations re: course of current medical issues. Questions answered. Outlined signs and symptoms indicating need for more acute intervention. Patient verbalized understanding. After Visit Summary given.      Elvina Sidle, MD 08/15/17 1419

## 2017-08-15 NOTE — ED Triage Notes (Signed)
Stomach  Cramps  Nauseated   Body  Aches   Chills     Symptoms   yest

## 2017-08-15 NOTE — Discharge Instructions (Signed)
Stay on clear liquids and crackers one to two days.  Return Saturday if not improving.

## 2017-12-17 IMAGING — CR DG CHEST 2V
2 series · 2 of 2 positions shown · non-contrast
Comparison: None.

CLINICAL DATA: Chest pain, cough, fever

EXAM:
CHEST  2 VIEW

[chest pa]
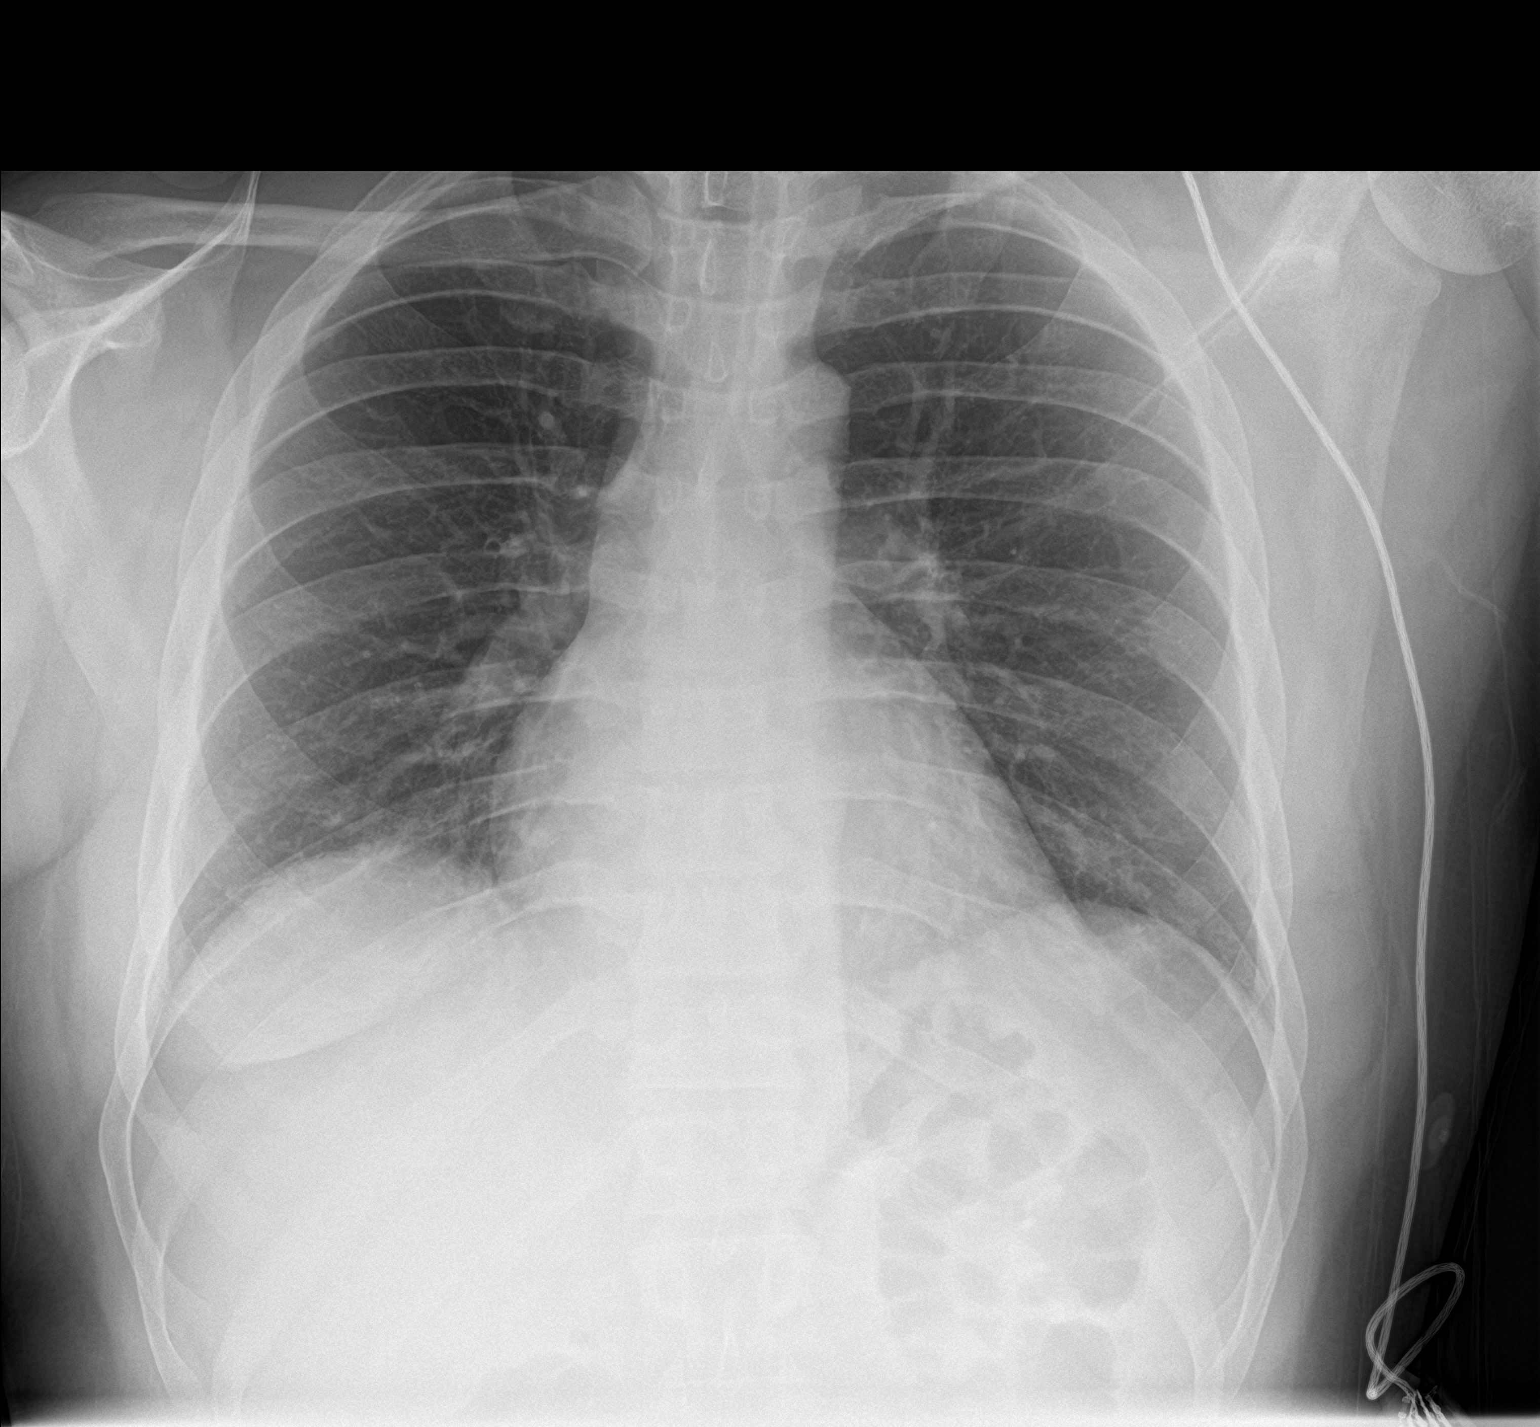

[chest lat]
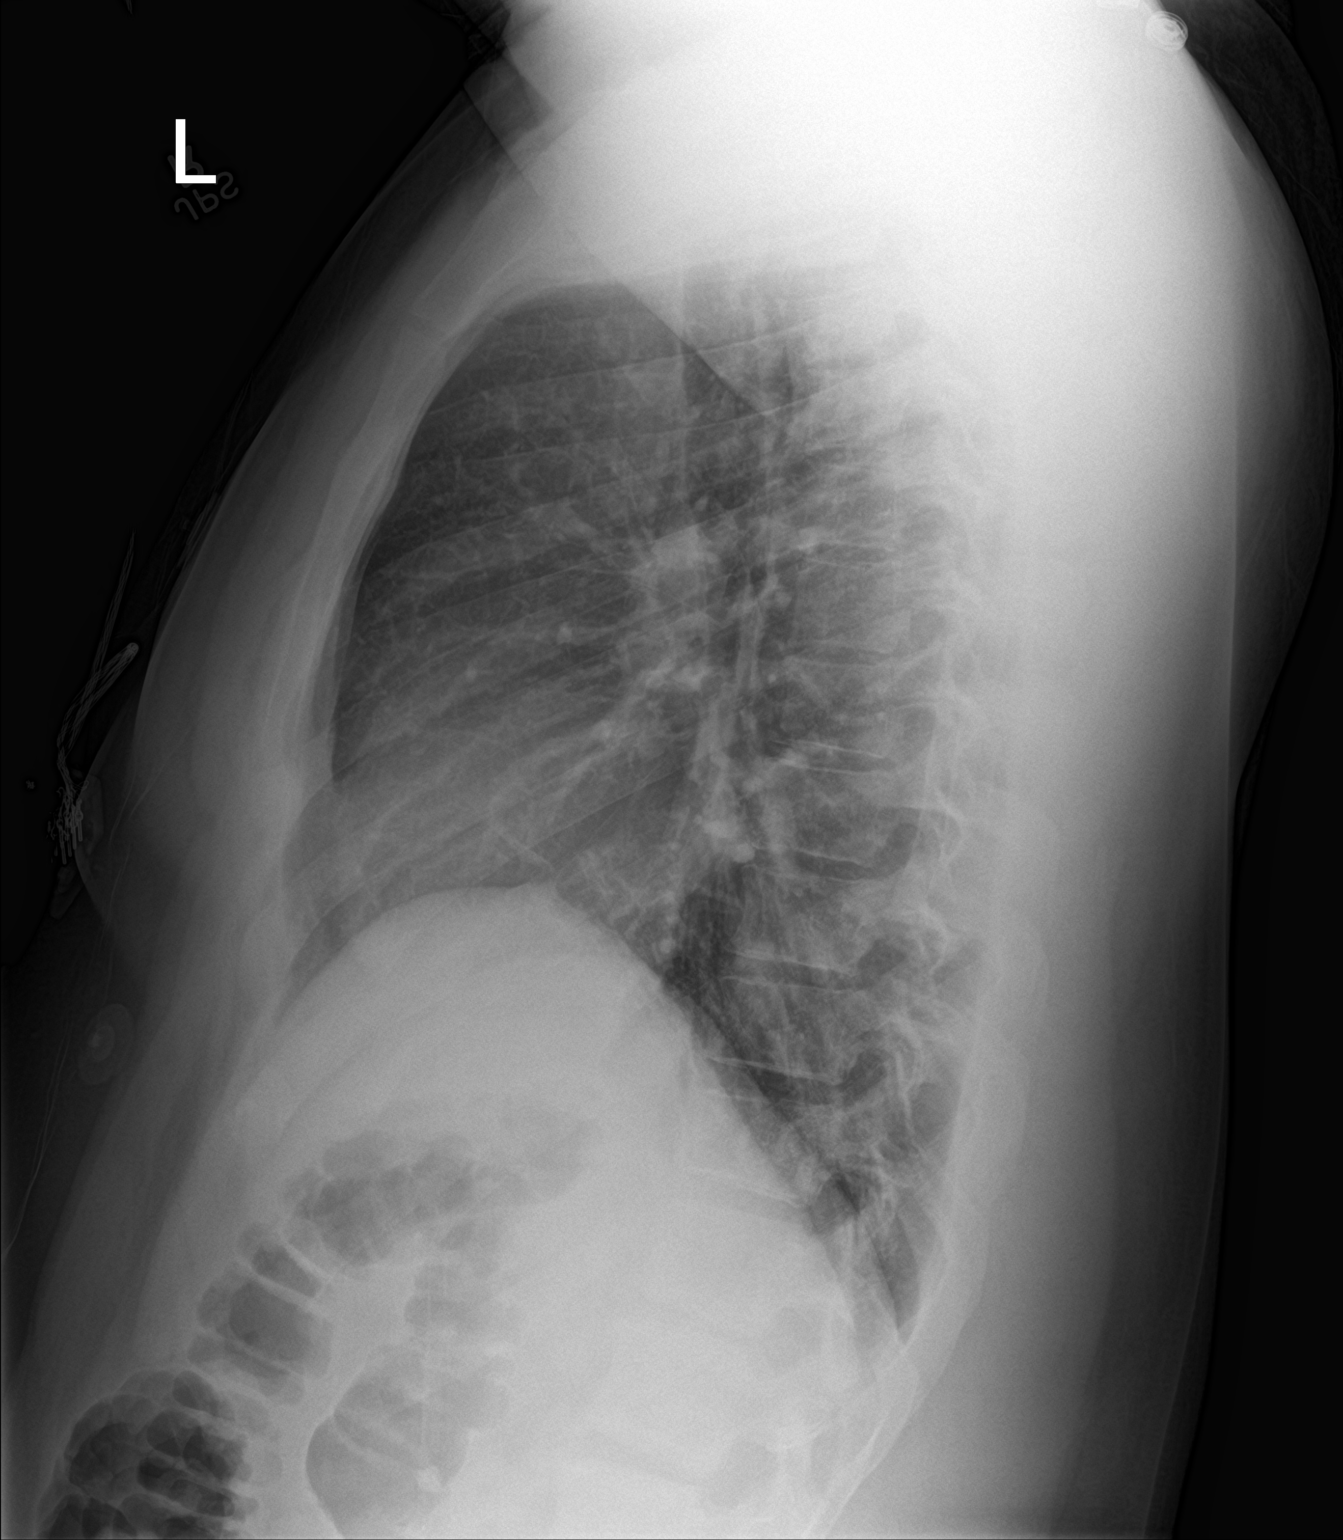

[2 of 2 positions shown; findings below may reference images not displayed]

FINDINGS: Cardiomediastinal silhouette is stable. No pulmonary edema. There is
streaky left base retrocardiac atelectasis or early infiltrate. Bony
thorax is unremarkable.
IMPRESSION: Streaky left base retrocardiac atelectasis or early infiltrate. No
pulmonary edema.

## 2018-06-06 IMAGING — CR DG CHEST 2V
2 series · 2 of 2 positions shown · non-contrast
Comparison: None.

CLINICAL DATA: Cough.

EXAM:
CHEST  2 VIEW

[w chest pa]
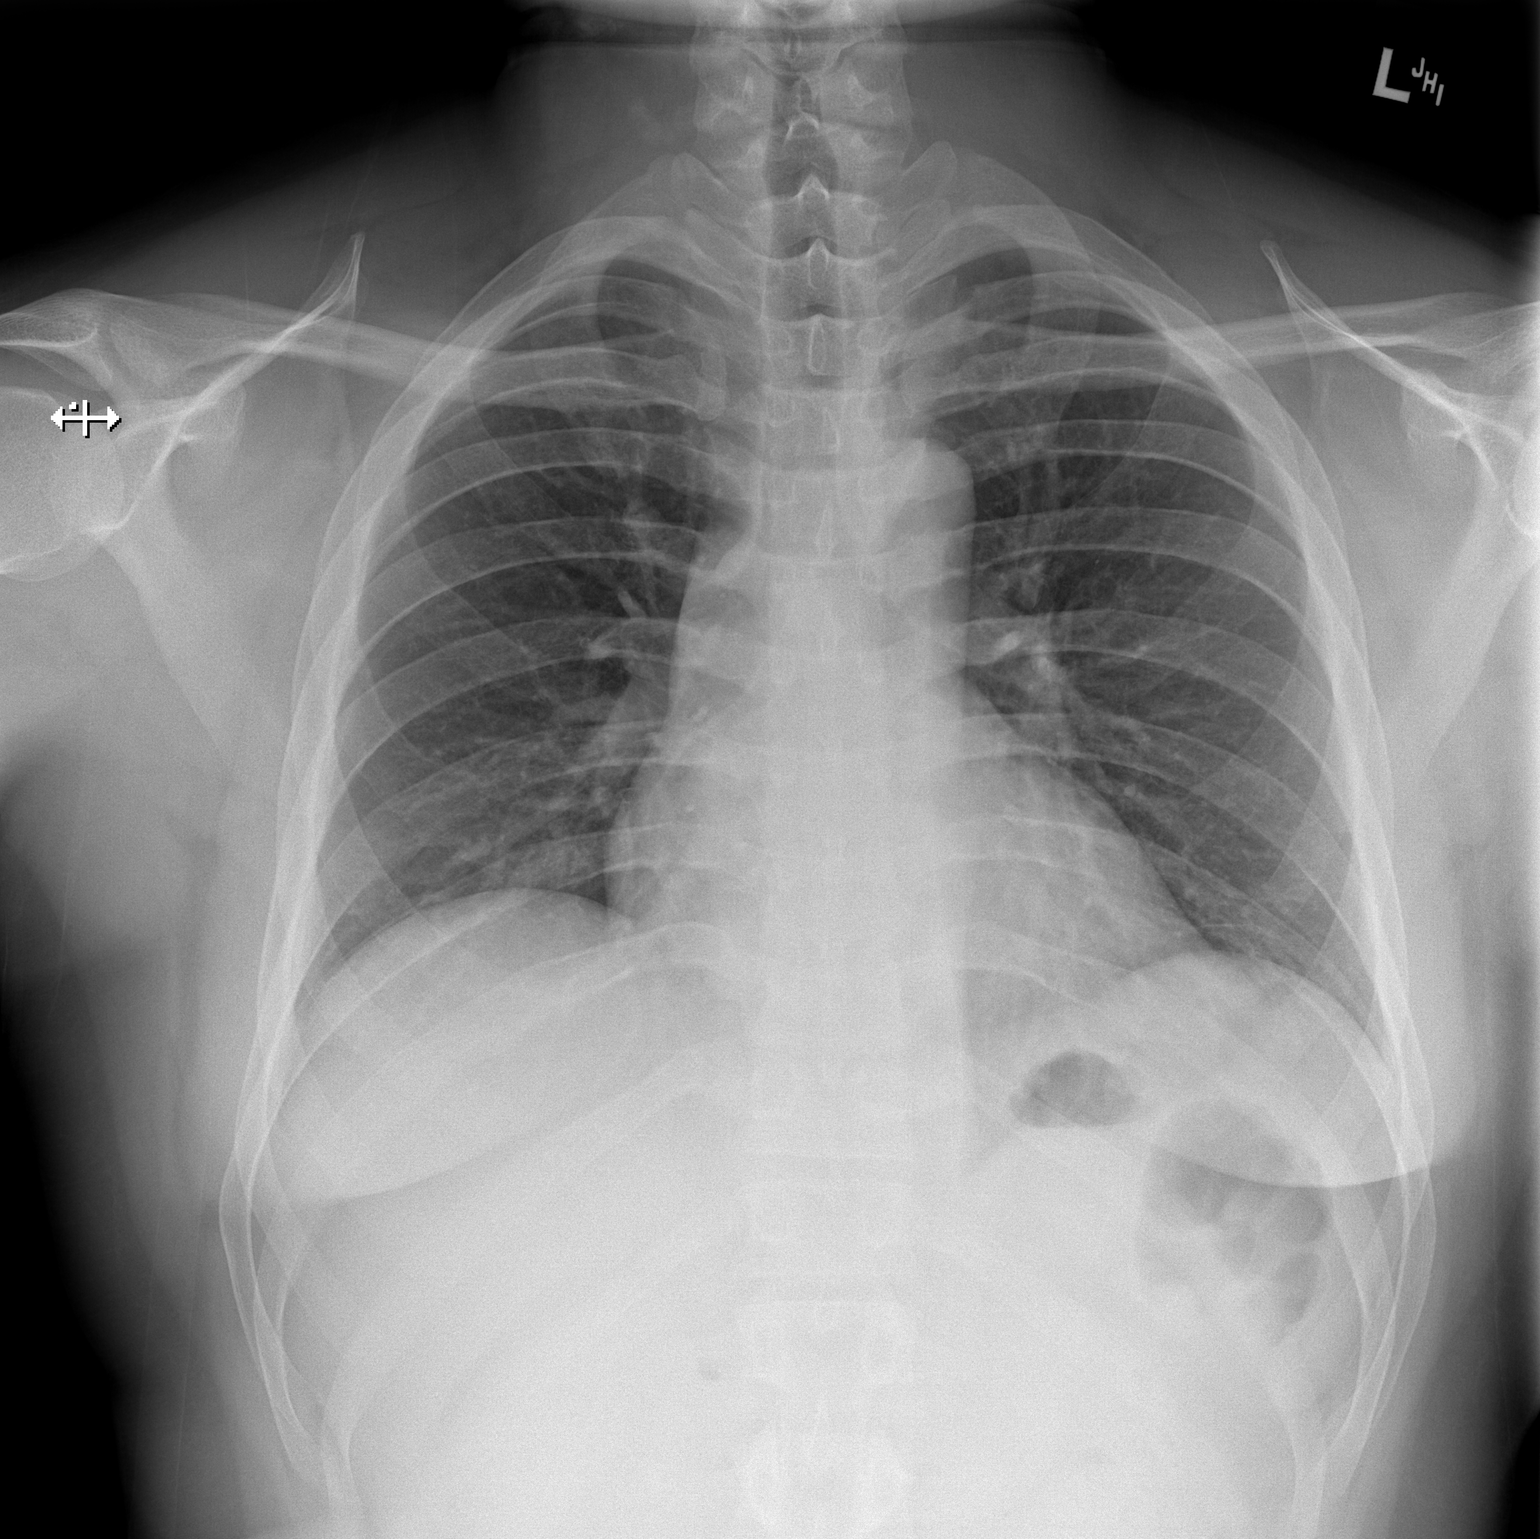

[w chest lat]
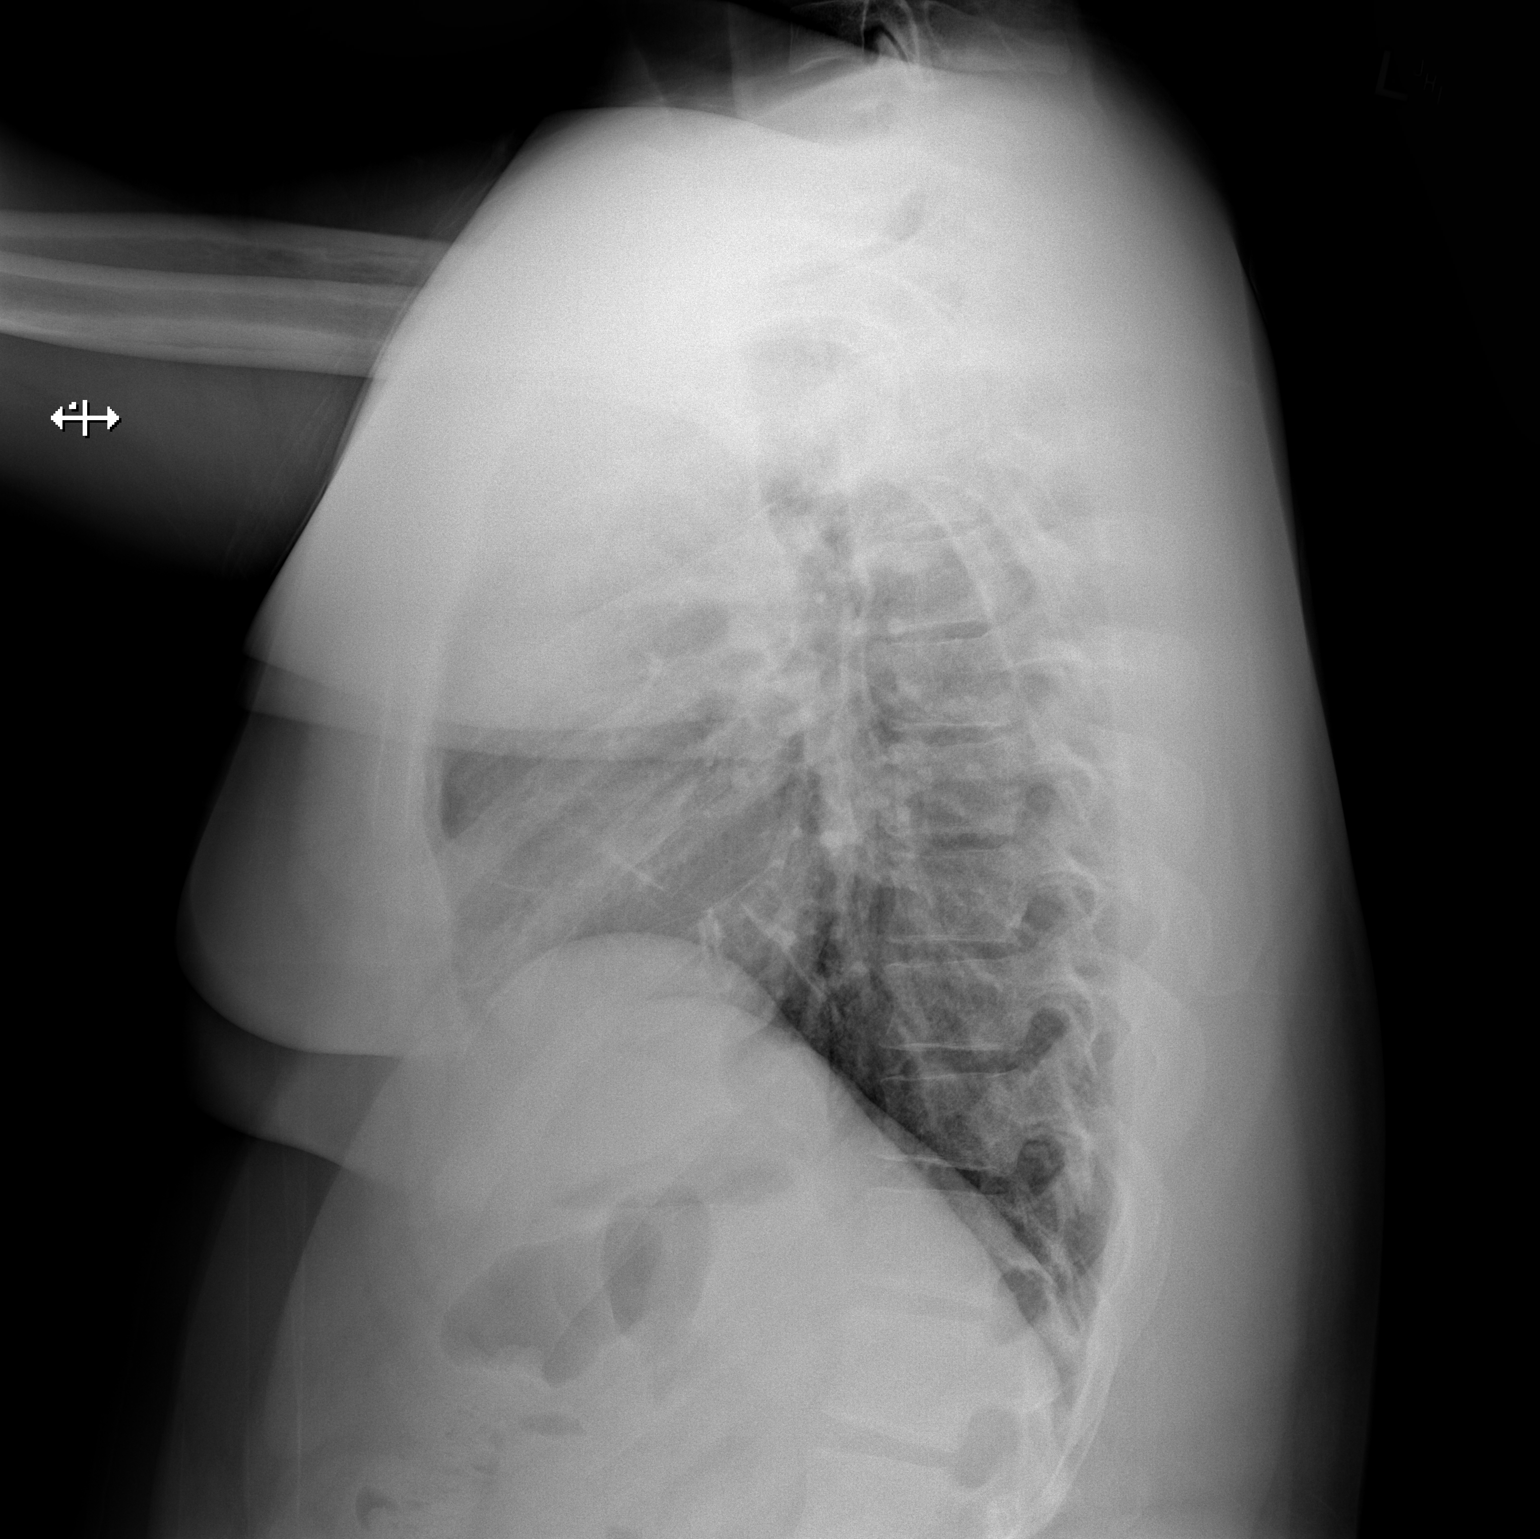

[2 of 2 positions shown; findings below may reference images not displayed]

FINDINGS: The heart size and mediastinal contours are within normal limits.
Both lungs are clear. The visualized skeletal structures are
unremarkable.
IMPRESSION: No active cardiopulmonary disease.

By: Ramil Valenzuela M.D.

## 2018-11-14 ENCOUNTER — Ambulatory Visit (HOSPITAL_COMMUNITY)
Admission: EM | Admit: 2018-11-14 | Discharge: 2018-11-14 | Disposition: A | Discharge status: Home / Self Care | Payer: BLUE CROSS/BLUE SHIELD | Attending: Internal Medicine | Admitting: Internal Medicine

## 2018-11-14 ENCOUNTER — Encounter (HOSPITAL_COMMUNITY): Payer: Self-pay | Admitting: Emergency Medicine

## 2018-11-14 DIAGNOSIS — J069 Acute upper respiratory infection, unspecified: Secondary | ICD-10-CM | POA: Insufficient documentation

## 2018-11-14 DIAGNOSIS — B9789 Other viral agents as the cause of diseases classified elsewhere: Secondary | ICD-10-CM | POA: Insufficient documentation

## 2018-11-14 MED ORDER — PREDNISONE 20 MG PO TABS: 40.0000 mg | ORAL_TABLET | Freq: Every day | ORAL | 0 refills | Status: AC

## 2018-11-14 MED ORDER — PROMETHAZINE-DM 6.25-15 MG/5ML PO SYRP: 5.0000 mL | ORAL_SOLUTION | Freq: Four times a day (QID) | ORAL | 0 refills | Status: DC | PRN

## 2018-11-14 NOTE — ED Triage Notes (Signed)
Pt here for URI sx and body aches with fever

## 2018-11-14 NOTE — ED Notes (Signed)
Patient able to ambulate independently  

## 2018-11-14 NOTE — ED Provider Notes (Signed)
[facility]    CSN: [id number] Arrival date & time: 11/14/18  1055     History   Chief Complaint Chief Complaint  Patient presents with  . URI    HPI Todd Mahoney is a 27 y.o. male.   Subjective:   Todd Mahoney is a 27 y.o. male here for evaluation of a cough.  The cough is without wheezing, dyspnea or hemoptysis, productive of green/yellow sputum, with shortness of breath during the cough, chest is painful during coughing, worsening over time and is aggravated by nothing. Onset of symptoms was 2 days ago and gradually worsening since that time.  Associated symptoms include fever and mild nasal discharge. Patient does not have a history of asthma. Patient has not had recent travel. Patient does have a history of smoking.   The following portions of the patient's history were reviewed and updated as appropriate: allergies, current medications, past family history, past medical history, past social history, past surgical history and problem list.         Past Medical History:  Diagnosis Date  . Chronic tonsillitis 03/2016  . Cough 04/03/2016    Patient Active Problem List   Diagnosis Date Noted  . Tobacco user 03/19/2012  . Allergic rhinitis, seasonal 03/19/2012  . Health care maintenance 01/15/2012    Past Surgical History:  Procedure Laterality Date  . NO PAST SURGERIES    . TONSILLECTOMY Bilateral 04/06/2016   Procedure: BILATERAL TONSILLECTOMY;  Surgeon: [name], MD;  Location: [facility];  Service: ENT;  Laterality: Bilateral;       Home Medications    Prior to Admission medications   Medication Sig Start Date End Date Taking? Authorizing Provider  colchicine 0.6 MG tablet Take 1 tablet (0.6 mg total) by mouth 2 (two) times daily. 06/01/17   Loren Racer, MD  dicyclomine (BENTYL) 20 MG tablet Take 1 tablet (20 mg total) by mouth 2 (two) times daily. 08/15/17   Elvina Sidle, MD  indomethacin (INDOCIN) 50 MG  capsule Take 1 capsule (50 mg total) by mouth 3 (three) times daily after meals. 06/01/17   Loren Racer, MD  loratadine (CLARITIN) 10 MG tablet Take 10 mg by mouth daily.    [provider]  ondansetron (ZOFRAN-ODT) 8 MG disintegrating tablet Take 1 tablet (8 mg total) by mouth every 8 (eight) hours as needed for nausea. 08/15/17   Elvina Sidle, MD  predniSONE (DELTASONE) 20 MG tablet Take 2 tablets (40 mg total) by mouth daily for 5 days. 11/14/18 11/19/18  Lurline Idol, FNP  promethazine-dextromethorphan (PROMETHAZINE-DM) 6.25-15 MG/5ML syrup Take 5 mLs by mouth 4 (four) times daily as needed for cough. 11/14/18   Lurline Idol, FNP    Family History Family History  Problem Relation Age of Onset  . Diabetes Mother     Social History Social History   Tobacco Use  . Smoking status: Current Some Day Smoker    Packs/day: 0.00    Years: 4.00    Pack years: 0.00    Types: Cigarettes  . Smokeless tobacco: Never Used  . Tobacco comment: 3-4 cig./week  Substance Use Topics  . Alcohol use: Yes    Comment: occasionally  . Drug use: No     Allergies   Patient has no known allergies.   Review of Systems Review of Systems  Constitutional: Positive for fever.  HENT: Positive for rhinorrhea.   Respiratory: Positive for cough and shortness of breath. Negative for wheezing.   Cardiovascular: Positive  for chest pain.  Gastrointestinal: Negative.   Neurological: Positive for headaches.  All other systems reviewed and are negative.    Physical Exam Triage Vital Signs ED Triage Vitals  Enc Vitals Group     BP 11/14/18 1156 (!) 167/98     Pulse Rate 11/14/18 1156 87     Resp 11/14/18 1156 18     Temp 11/14/18 1156 100.2 F (37.9 C)     Temp Source 11/14/18 1156 Oral     SpO2 11/14/18 1156 96 %     Weight --      Height --      Head Circumference --      Peak Flow --      Pain Score 11/14/18 1157 7     Pain Loc --      Pain Edu? --      Excl. in GC?  --    No data found.  Updated Vital Signs BP (!) 167/98 (BP Location: Right Arm)   Pulse 87   Temp 100.2 F (37.9 C) (Oral)   Resp 18   SpO2 96%   Visual Acuity Right Eye Distance:   Left Eye Distance:   Bilateral Distance:    Right Eye Near:   Left Eye Near:    Bilateral Near:     Physical Exam Vitals signs reviewed.  Constitutional:      General: He is not in acute distress.    Appearance: Normal appearance. He is not ill-appearing, toxic-appearing or diaphoretic.  HENT:     Head: Normocephalic.  Neck:     Musculoskeletal: Normal range of motion and neck supple.  Cardiovascular:     Rate and Rhythm: Normal rate and regular rhythm.  Pulmonary:     Effort: Pulmonary effort is normal. No respiratory distress.     Breath sounds: Normal breath sounds. No wheezing.  Musculoskeletal: Normal range of motion.  Skin:    General: Skin is warm and dry.  Neurological:     General: No focal deficit present.     Mental Status: He is alert and oriented to person, place, and time.  Psychiatric:        Mood and Affect: Mood normal.        Behavior: Behavior normal.      UC Treatments / Results  Labs (all labs ordered are listed, but only abnormal results are displayed) Labs Reviewed - No data to display  EKG None  Radiology No results found.  Procedures Procedures (including critical care time)  Medications Ordered in UC Medications - No data to display  Initial Impression / Assessment and Plan / UC Course  I have reviewed the triage vital signs and the nursing notes.  Pertinent labs & imaging results that were available during my care of the patient were reviewed by me and considered in my medical decision making (see chart for details).     27 yo male presenting with a two-day history of progressively worsening cough and fevers.  Patient is afebrile.  Vital signs stable.  No acute distress noted.  Supportive care advised at this time. Explained lack of  efficacy of antibiotics in viral disease. Antitussives and steroids per medication orders. Mucinex DM twice daily. Tylenol or ibuprofen as needed for fevers. Avoid exposure to tobacco smoke and fumes. Go to ED if shortness of breath worsens, blood in sputum, change in character of cough, persistent fevers unrelieved with tylenol/ibuprofen, chills or the inability to maintain nutrition and hydration.   Today's  evaluation has revealed no signs of a dangerous process. Discussed diagnosis with patient. Patient aware of their diagnosis, possible red flag symptoms to watch out for and need for close follow up. Patient understands verbal and written discharge instructions. Patient comfortable with plan and disposition.  Patient has a clear mental status at this time, good insight into illness (after discussion and teaching) and has clear judgment to make decisions regarding their care.  Documentation was completed with the aid of voice recognition software. Transcription may contain typographical errors. Final Clinical Impressions(s) / UC Diagnoses   Final diagnoses:  Viral URI with cough     Discharge Instructions     Take medications as prescribed. Also start Mucinex DM twice daily. Continue tylenol or ibuprofen as needed for fevers. Drink plenty of fluids. Go to ED  f shortness of breath worsens, blood in sputum, change in character of cough, persistent fevers unrelieved with tylenol/ibuprofen, chills or the inability to maintain nutrition and hydration.     ED Prescriptions    Medication Sig Dispense Auth. Provider   predniSONE (DELTASONE) 20 MG tablet Take 2 tablets (40 mg total) by mouth daily for 5 days. 10 tablet Lurline IdolMurrill, Honestie Kulik, FNP   promethazine-dextromethorphan (PROMETHAZINE-DM) 6.25-15 MG/5ML syrup Take 5 mLs by mouth 4 (four) times daily as needed for cough. 118 mL Lurline IdolMurrill, Emelio Schneller, FNP     Controlled Substance Prescriptions [place] Controlled Substance Registry consulted? Not  Applicable   Lurline IdolMurrill, Iness Pangilinan, [place]NP 11/14/18 [phone]

## 2018-11-14 NOTE — Discharge Instructions (Addendum)
Take medications as prescribed. Also start Mucinex DM twice daily. Continue tylenol or ibuprofen as needed for fevers. Drink plenty of fluids. Go to ED if shortness of breath worsens, blood in sputum, change in character of cough, persistent fevers unrelieved with tylenol/ibuprofen, chills or the inability to maintain nutrition and hydration.

## 2023-08-07 ENCOUNTER — Ambulatory Visit
Admission: EM | Admit: 2023-08-07 | Discharge: 2023-08-07 | Disposition: A | Discharge status: Home / Self Care | Payer: BC Managed Care – PPO | Attending: Internal Medicine | Admitting: Internal Medicine

## 2023-08-07 ENCOUNTER — Other Ambulatory Visit: Payer: Self-pay

## 2023-08-07 DIAGNOSIS — R030 Elevated blood-pressure reading, without diagnosis of hypertension: Secondary | ICD-10-CM

## 2023-08-07 MED ORDER — AMLODIPINE BESYLATE 5 MG PO TABS: 5.0000 mg | ORAL_TABLET | Freq: Every day | ORAL | 0 refills | Status: DC

## 2023-08-07 NOTE — ED Triage Notes (Signed)
Pt is here after his BP being high after it being retaken 7 times at the dentist, he was not able to get his tooth extracted today as planned.

## 2023-08-07 NOTE — Discharge Instructions (Addendum)
The clinic will contact you with results of the blood work done today if abnormal.  Please purchase an upper arm blood pressure cuff and check your blood pressure twice daily for the next 2 weeks.  Keep a log and take this to your primary care doctor at your scheduled appointment.  Try to cut back on salty foods including canned, processed, fried foods.

## 2023-08-07 NOTE — ED Provider Notes (Signed)
[facility]    CSN: [id number] Arrival date & time: 08/07/23  1538      History   Chief Complaint Chief Complaint  Patient presents with   Hypertension    HPI Todd Mahoney is a 32 y.o. male presents for evaluation of elevated blood pressure.  Patient went to his dentist today and after taking his blood pressure 7 times it remained elevated and he was advised to go to urgent care.  He denies any history of hypertension.  He does not check his blood pressure regularly.  He is a [employer] and does eat on the road a lot but denies fried foods.  No smoking history.  Denies history of diabetes or hyperlipidemia.  Denies family history of hypertension.  He currently denies any headache, chest pain, shortness of breath, visual changes, dizziness.  No other concerns at this time.   Hypertension    Past Medical History:  Diagnosis Date   Chronic tonsillitis 03/2016   Cough 04/03/2016    Patient Active Problem List   Diagnosis Date Noted   Tobacco user 03/19/2012   Allergic rhinitis, seasonal 03/19/2012   Health care maintenance 01/15/2012    Past Surgical History:  Procedure Laterality Date   NO PAST SURGERIES     TONSILLECTOMY Bilateral 04/06/2016   Procedure: BILATERAL TONSILLECTOMY;  Surgeon: [name], MD;  Location: [facility];  Service: ENT;  Laterality: Bilateral;       Home Medications    Prior to Admission medications   Medication Sig Start Date End Date Taking? Authorizing Provider  amLODipine (NORVASC) 5 MG tablet Take 1 tablet (5 mg total) by mouth daily. 08/07/23  Yes Radford Pax, NP  colchicine 0.6 MG tablet Take 1 tablet (0.6 mg total) by mouth 2 (two) times daily. 06/01/17   Loren Racer, MD  dicyclomine (BENTYL) 20 MG tablet Take 1 tablet (20 mg total) by mouth 2 (two) times daily. 08/15/17   Elvina Sidle, MD  indomethacin (INDOCIN) 50 MG capsule Take 1 capsule (50 mg total) by mouth 3 (three) times daily after  meals. 06/01/17   Loren Racer, MD  loratadine (CLARITIN) 10 MG tablet Take 10 mg by mouth daily.    [provider]  ondansetron (ZOFRAN-ODT) 8 MG disintegrating tablet Take 1 tablet (8 mg total) by mouth every 8 (eight) hours as needed for nausea. 08/15/17   Elvina Sidle, MD  promethazine-dextromethorphan (PROMETHAZINE-DM) 6.25-15 MG/5ML syrup Take 5 mLs by mouth 4 (four) times daily as needed for cough. 11/14/18   Lurline Idol, FNP    Family History Family History  Problem Relation Age of Onset   Diabetes Mother     Social History Social History   Tobacco Use   Smoking status: Some Days    Current packs/day: 0.00    Types: Cigarettes   Smokeless tobacco: Current   Tobacco comments:    3-4 cig./week  Vaping Use   Vaping status: Every Day  Substance Use Topics   Alcohol use: Yes    Comment: occasionally   Drug use: No     Allergies   Patient has no known allergies.   Review of Systems Review of Systems  Cardiovascular:        Elevated blood pressure     Physical Exam Triage Vital Signs ED Triage Vitals  Encounter Vitals Group     BP 08/07/23 1638 (!) 153/115     Systolic BP Percentile --      Diastolic BP  Percentile --      Pulse Rate 08/07/23 1638 (!) 58     Resp 08/07/23 1638 18     Temp 08/07/23 1638 97.8 F (36.6 C)     Temp Source 08/07/23 1638 Oral     SpO2 08/07/23 1638 97 %     Weight --      Height --      Head Circumference --      Peak Flow --      Pain Score 08/07/23 1633 0     Pain Loc --      Pain Education --      Exclude from Growth Chart --    No data found.  Updated Vital Signs BP (!) 170/118 (BP Location: Left Arm)   Pulse (!) 58   Temp 97.8 F (36.6 C) (Oral)   Resp 19   SpO2 97%   Visual Acuity Right Eye Distance:   Left Eye Distance:   Bilateral Distance:    Right Eye Near:   Left Eye Near:    Bilateral Near:     Physical Exam Vitals and nursing note reviewed.  Constitutional:      General:  He is not in acute distress.    Appearance: Normal appearance. He is not ill-appearing.  HENT:     Head: Normocephalic and atraumatic.  Eyes:     Pupils: Pupils are equal, round, and reactive to light.  Cardiovascular:     Rate and Rhythm: Normal rate and regular rhythm.     Heart sounds: Normal heart sounds.  Pulmonary:     Effort: Pulmonary effort is normal.     Breath sounds: Normal breath sounds.  Skin:    General: Skin is warm and dry.  Neurological:     General: No focal deficit present.     Mental Status: He is alert and oriented to person, place, and time.  Psychiatric:        Mood and Affect: Mood normal.        Behavior: Behavior normal.      UC Treatments / Results  Labs (all labs ordered are listed, but only abnormal results are displayed) Labs Reviewed  BASIC METABOLIC PANEL    EKG   Radiology No results found.  Procedures Procedures (including critical care time)  Medications Ordered in UC Medications - No data to display  Initial Impression / Assessment and Plan / UC Course  I have reviewed the triage vital signs and the nursing notes.  Pertinent labs & imaging results that were available during my care of the patient were reviewed by me and considered in my medical decision making (see chart for details).     Discussed exam and blood pressure readings with patient.  BP on recheck 153/115.  Patient remains asymptomatic.  Will draw baseline BMP to check kidney function and will start 5 of amlodipine.  Instructed to get a upper arm blood pressure cuff to check his blood pressure twice daily for 2 weeks and take to his PCP.  As he does not currently have a PCP nursing staff was able to make an appointment for him.  Discussed DASH diet.  ER precautions reviewed and patient verbalized understanding. Final Clinical Impressions(s) / UC Diagnoses   Final diagnoses:  Elevated BP without diagnosis of hypertension     Discharge Instructions      The  clinic will contact you with results of the blood work done today if abnormal.  Please purchase an upper arm  blood pressure cuff and check your blood pressure twice daily for the next 2 weeks.  Keep a log and take this to your primary care doctor at your scheduled appointment.  Try to cut back on salty foods including canned, processed, fried foods.     ED Prescriptions     Medication Sig Dispense Auth. Provider   amLODipine (NORVASC) 5 MG tablet Take 1 tablet (5 mg total) by mouth daily. 30 tablet Radford Pax, NP      PDMP not reviewed this encounter.   Radford Pax, NP 08/07/23 1742

## 2023-08-08 LAB — BASIC METABOLIC PANEL
BUN/Creatinine Ratio: 7 — ABNORMAL LOW (ref 9–20)
BUN: 8 mg/dL (ref 6–20)
CO2: 24 mmol/L (ref 20–29)
Calcium: 9.9 mg/dL (ref 8.7–10.2)
Chloride: 103 mmol/L (ref 96–106)
Creatinine, Ser: 1.09 mg/dL (ref 0.76–1.27)
Glucose: 95 mg/dL (ref 70–99)
Potassium: 4.4 mmol/L (ref 3.5–5.2)
Sodium: 142 mmol/L (ref 134–144)
eGFR: 93 mL/min/{1.73_m2} (ref >59)

## 2023-08-19 ENCOUNTER — Ambulatory Visit (HOSPITAL_BASED_OUTPATIENT_CLINIC_OR_DEPARTMENT_OTHER): Payer: BC Managed Care – PPO | Admitting: Family Medicine

## 2023-08-21 ENCOUNTER — Telehealth: Payer: Self-pay | Admitting: General Practice

## 2023-08-21 NOTE — Telephone Encounter (Signed)
Patient's spouse, Forestine Na (DOB: 03[birth year]09[birth year]91) called in wanting to know if you would take him as  anew patient. Stated that you did a procedure for her. Let spouse know that you were not accepting new patients unless she was established with you for Primary Care. She still wanted me to ask.

## 2023-08-21 NOTE — Telephone Encounter (Signed)
Thank you for asking, I am not taking new primary care patients, and his wife is not established with me for primary care.  Happy to see him for any orthopedic complaints.  He could establish with Christen Butter, DNP or Dr. Stephens Shire.  We also have the [place] location.

## 2023-08-29 ENCOUNTER — Encounter (HOSPITAL_BASED_OUTPATIENT_CLINIC_OR_DEPARTMENT_OTHER): Payer: Self-pay | Admitting: Family Medicine

## 2023-08-29 ENCOUNTER — Ambulatory Visit (HOSPITAL_BASED_OUTPATIENT_CLINIC_OR_DEPARTMENT_OTHER): Payer: BC Managed Care – PPO | Admitting: Family Medicine

## 2023-08-29 VITALS — BP 145/101 | HR 71 | Ht 72.0 in | Wt 231.2 lb

## 2023-08-29 DIAGNOSIS — Z23 Encounter for immunization: Secondary | ICD-10-CM

## 2023-08-29 DIAGNOSIS — I10 Essential (primary) hypertension: Secondary | ICD-10-CM | POA: Diagnosis not present

## 2023-08-29 DIAGNOSIS — Z7689 Persons encountering health services in other specified circumstances: Secondary | ICD-10-CM

## 2023-08-29 MED ORDER — AMLODIPINE BESYLATE 10 MG PO TABS: 10.0000 mg | ORAL_TABLET | Freq: Every day | ORAL | 2 refills | Status: DC

## 2023-08-29 NOTE — Progress Notes (Signed)
New Patient Office Visit  Subjective    Patient ID: Todd Mahoney, male    DOB: [birth year]  Age: 32 y.o. MRN: [id number]  CC:  Chief Complaint  Patient presents with   Hypertension    Home readings 150's over 103, typically still always elevated   HPI Todd Mahoney is a 32 year old male who presents to establish care and is here for the medical management of hypertension.  Denies chest pain, palpitations, changes in vision, shortness of breath, lower extremity edema, headaches, lightheadedness, weakness, cough.   He was seen at [facility] for HTN after going to his dentist and having multiple elevated readings.  Patient's current hypertension medication regimen is: amlodipine 5mg  daily in the morning  Patient is currently taking prescribed medications for HTN.  Patient is regularly keeping a check on BP at home. Home readings: 150s/100s Before he started this medication, he would regularly have his BPs in 170s/120s Adhering to low sodium diet: yes Exercising Regularly: yes, he is a [employer]. He gets out of the truck and walks around when he is driving.   BP Readings from Last 3 Encounters:  08/29/23 (!) 145/101  08/07/23 (!) 170/118  11/14/18 (!) 167/98   Outpatient Encounter Medications as of 08/29/2023  Medication Sig   loratadine (CLARITIN) 10 MG tablet Take 10 mg by mouth daily as needed for allergies or rhinitis.   [DISCONTINUED] amLODipine (NORVASC) 5 MG tablet Take 1 tablet (5 mg total) by mouth daily.   amLODipine (NORVASC) 10 MG tablet Take 1 tablet (10 mg total) by mouth daily.   [DISCONTINUED] colchicine 0.6 MG tablet Take 1 tablet (0.6 mg total) by mouth 2 (two) times daily. (Patient not taking: Reported on 08/29/2023)   [DISCONTINUED] dicyclomine (BENTYL) 20 MG tablet Take 1 tablet (20 mg total) by mouth 2 (two) times daily. (Patient not taking: Reported on 08/29/2023)   [DISCONTINUED] indomethacin (INDOCIN) 50 MG capsule Take 1 capsule (50 mg total) by mouth  3 (three) times daily after meals. (Patient not taking: Reported on 08/29/2023)   [DISCONTINUED] ondansetron (ZOFRAN-ODT) 8 MG disintegrating tablet Take 1 tablet (8 mg total) by mouth every 8 (eight) hours as needed for nausea. (Patient not taking: Reported on 08/29/2023)   [DISCONTINUED] promethazine-dextromethorphan (PROMETHAZINE-DM) 6.25-15 MG/5ML syrup Take 5 mLs by mouth 4 (four) times daily as needed for cough. (Patient not taking: Reported on 08/29/2023)   No facility-administered encounter medications on file as of 08/29/2023.   Past Medical History:  Diagnosis Date   Chronic tonsillitis 03/2016   Cough 04/03/2016    Past Surgical History:  Procedure Laterality Date   NO PAST SURGERIES     TONSILLECTOMY Bilateral 04/06/2016   Procedure: BILATERAL TONSILLECTOMY;  Surgeon: [name], MD;  Location: [facility];  Service: ENT;  Laterality: Bilateral;    Family History  Problem Relation Age of Onset   Diabetes Mother    Hypertension Mother    Hypertension Brother     Review of Systems  Constitutional:  Negative for malaise/fatigue.  Eyes:  Negative for blurred vision and double vision.  Respiratory:  Negative for cough and shortness of breath.   Cardiovascular:  Negative for chest pain, palpitations and leg swelling.  Gastrointestinal:  Negative for abdominal pain, nausea and vomiting.  Musculoskeletal:  Negative for myalgias.  Neurological:  Negative for dizziness, speech change, weakness and headaches.  Psychiatric/Behavioral:  Negative for depression and suicidal ideas. The patient is not nervous/anxious and does not have insomnia.  Objective    BP (!) 145/101 Comment: Repeat BP  Pulse 71   Ht 6' (1.829 m)   Wt 231 lb 3.2 oz (104.9 kg)   SpO2 99%   BMI 31.36 kg/m   Physical Exam Constitutional:      Appearance: Normal appearance.  Cardiovascular:     Rate and Rhythm: Normal rate and regular rhythm.     Pulses: Normal pulses.     Heart  sounds: Normal heart sounds.  Pulmonary:     Effort: Pulmonary effort is normal.     Breath sounds: Normal breath sounds.  Neurological:     Mental Status: He is alert.  Psychiatric:        Mood and Affect: Mood normal.        Behavior: Behavior normal.        Assessment & Plan:   1. Encounter to establish care Patient is a 32 year old male who presents today to establish care with primary care at [facility]. Reviewed the past medical history, family history, social history, surgical history, medications and allergies today- updates made as indicated. Patient does not have any concerns today- he is here for blood pressure management. Will update blood work today.  - CBC with Differential/Platelet - Comprehensive metabolic panel - Hemoglobin A1c - Lipid panel - TSH Rfx on Abnormal to Free T4  2. Primary hypertension Patient presents today with slightly elevated blood pressure. Patient in no acute distress and is well-appearing. Denies chest pain, shortness of breath, lower extremity edema, vision changes, headaches. Cardiovascular exam with heart regular rate and rhythm. Normal heart sounds, no murmurs present. No lower extremity edema present. Lungs clear to auscultation bilaterally. Patient is currently taking amlodipine 5mg  daily. Refills provided today. Advised patient to closely monitor blood pressure as we change his dose. Return to office sooner if blood pressure begins to increase greater than 130/80. Follow-up in 4 weeks.    - amLODipine (NORVASC) 10 MG tablet; Take 1 tablet (10 mg total) by mouth daily.  Dispense: 30 tablet; Refill: 2  3. Encounter for immunization Patient agreeable to influenza immunization.  - Flu vaccine trivalent PF, 6mos and older (Flulaval,Afluria,Fluarix,Fluzone)   Return in about 4 weeks (around 09/26/2023) for HTN follow-up.   Alyson Reedy, FNP

## 2023-08-29 NOTE — Patient Instructions (Signed)

## 2023-08-30 LAB — COMPREHENSIVE METABOLIC PANEL
ALT: 27 [IU]/L (ref 0–44)
AST: 23 [IU]/L (ref 0–40)
Albumin: 4.5 g/dL (ref 4.1–5.1)
Alkaline Phosphatase: 95 [IU]/L (ref 44–121)
BUN/Creatinine Ratio: 10 (ref 9–20)
BUN: 9 mg/dL (ref 6–20)
Bilirubin Total: 0.5 mg/dL (ref 0.0–1.2)
CO2: 24 mmol/L (ref 20–29)
Calcium: 9.8 mg/dL (ref 8.7–10.2)
Chloride: 100 mmol/L (ref 96–106)
Creatinine, Ser: 0.9 mg/dL (ref 0.76–1.27)
Globulin, Total: 3.4 g/dL (ref 1.5–4.5)
Glucose: 88 mg/dL (ref 70–99)
Potassium: 4.1 mmol/L (ref 3.5–5.2)
Sodium: 139 mmol/L (ref 134–144)
Total Protein: 7.9 g/dL (ref 6.0–8.5)
eGFR: 117 mL/min/{1.73_m2} (ref >59)

## 2023-08-30 LAB — CBC WITH DIFFERENTIAL/PLATELET
Basophils Absolute: 0 10*3/uL (ref 0.0–0.2)
Basos: 1 %
EOS (ABSOLUTE): 0.2 10*3/uL (ref 0.0–0.4)
Eos: 4 %
Hematocrit: 49 % (ref 37.5–51.0)
Hemoglobin: 16.4 g/dL (ref 13.0–17.7)
Immature Grans (Abs): 0 10*3/uL (ref 0.0–0.1)
Immature Granulocytes: 0 %
Lymphocytes Absolute: 1.9 10*3/uL (ref 0.7–3.1)
Lymphs: 42 %
MCH: 30.8 pg (ref 26.6–33.0)
MCHC: 33.5 g/dL (ref 31.5–35.7)
MCV: 92 fL (ref 79–97)
Monocytes Absolute: 0.5 10*3/uL (ref 0.1–0.9)
Monocytes: 11 %
Neutrophils Absolute: 1.9 10*3/uL (ref 1.4–7.0)
Neutrophils: 42 %
Platelets: 273 10*3/uL (ref 150–450)
RBC: 5.32 x10E6/uL (ref 4.14–5.80)
RDW: 12.1 % (ref 11.6–15.4)
WBC: 4.6 10*3/uL (ref 3.4–10.8)

## 2023-08-30 LAB — LIPID PANEL
Chol/HDL Ratio: 3.9 {ratio} (ref 0.0–5.0)
Cholesterol, Total: 202 mg/dL — ABNORMAL HIGH (ref 100–199)
HDL: 52 mg/dL (ref >39)
LDL Chol Calc (NIH): 105 mg/dL — ABNORMAL HIGH (ref 0–99)
Triglycerides: 266 mg/dL — ABNORMAL HIGH (ref 0–149)
VLDL Cholesterol Cal: 45 mg/dL — ABNORMAL HIGH (ref 5–40)

## 2023-08-30 LAB — HEMOGLOBIN A1C
Est. average glucose Bld gHb Est-mCnc: 108 mg/dL
Hgb A1c MFr Bld: 5.4 % (ref 4.8–5.6)

## 2023-08-30 LAB — TSH RFX ON ABNORMAL TO FREE T4: TSH: 0.682 u[IU]/mL (ref 0.450–4.500)

## 2023-09-26 ENCOUNTER — Ambulatory Visit (HOSPITAL_BASED_OUTPATIENT_CLINIC_OR_DEPARTMENT_OTHER): Payer: BC Managed Care – PPO | Admitting: Family Medicine

## 2023-09-30 ENCOUNTER — Ambulatory Visit (INDEPENDENT_AMBULATORY_CARE_PROVIDER_SITE_OTHER): Payer: BC Managed Care – PPO | Admitting: Family Medicine

## 2023-09-30 VITALS — BP 152/103 | HR 64 | Ht 72.0 in | Wt 234.6 lb

## 2023-09-30 DIAGNOSIS — I10 Essential (primary) hypertension: Secondary | ICD-10-CM

## 2023-09-30 DIAGNOSIS — R3121 Asymptomatic microscopic hematuria: Secondary | ICD-10-CM

## 2023-09-30 DIAGNOSIS — R319 Hematuria, unspecified: Secondary | ICD-10-CM | POA: Diagnosis not present

## 2023-09-30 MED ORDER — HYDROCHLOROTHIAZIDE 12.5 MG PO TABS: 12.5000 mg | ORAL_TABLET | Freq: Every day | ORAL | 3 refills | Status: AC

## 2023-09-30 MED ORDER — AMLODIPINE BESYLATE 10 MG PO TABS: 10.0000 mg | ORAL_TABLET | Freq: Every day | ORAL | 2 refills | Status: AC

## 2023-09-30 NOTE — Patient Instructions (Signed)

## 2023-09-30 NOTE — Progress Notes (Signed)
Established Patient Office Visit  Subjective   Patient ID: Todd Mahoney, male    DOB: 05/23/1991  Age: 32 y.o. 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  Patient's current hypertension medication regimen is: amlodipine 10mg   Patient is currently taking prescribed medications for HTN.  Patient is regularly keeping a check on BP at home. Home readings are around: 150/80-100s Reports headache with stressful day.  Adhering to low sodium diet: yes Exercising Regularly: yes Denies dizziness, CP, SHOB, vision changes.   Hematuria- reports that on 3 separate DOT exams, trace amount of blood in urine  Denies urinary symptoms and flank pain    BP Readings from Last 3 Encounters:  09/30/23 (!) 152/103  08/29/23 (!) 145/101  08/07/23 (!) 170/118   Review of Systems  Constitutional:  Negative for malaise/fatigue.  Eyes:  Negative for blurred vision and double vision.  Respiratory:  Negative for cough and shortness of breath.   Cardiovascular:  Negative for chest pain, palpitations and leg swelling.  Gastrointestinal:  Negative for abdominal pain, nausea and vomiting.  Genitourinary:  Negative for frequency and urgency.  Musculoskeletal:  Negative for myalgias.  Neurological:  Negative for dizziness, weakness and headaches.  Psychiatric/Behavioral:  Negative for depression and suicidal ideas. The patient is not nervous/anxious and does not have insomnia.       Objective:     BP (!) 152/103 Comment: Repeat BP  Pulse 64   Ht 6' (1.829 m)   Wt 234 lb 9.6 oz (106.4 kg)   SpO2 100%   BMI 31.82 kg/m  BP Readings from Last 3  Encounters:  09/30/23 (!) 152/103  08/29/23 (!) 145/101  08/07/23 (!) 170/118     Physical Exam Constitutional:      Appearance: Normal appearance.  Cardiovascular:     Rate and Rhythm: Normal rate and regular rhythm.     Pulses: Normal pulses.     Heart sounds: Normal heart sounds.  Pulmonary:     Effort: Pulmonary effort is normal.     Breath sounds: Normal breath sounds.  Neurological:     Mental Status: He is alert.  Psychiatric:        Mood and Affect: Mood normal.        Behavior: Behavior normal.     Assessment & Plan:   Primary hypertension Assessment & Plan: Patient presents today with slightly elevated blood pressure, repeat still elevated. Patient in no acute distress and is well-appearing. Denies chest pain, shortness of breath, lower extremity edema, vision changes, headaches. Cardiovascular exam with heart regular rate and rhythm. Normal heart sounds, no murmurs present. No lower extremity edema present. Lungs clear to auscultation bilaterally. Patient is currently taking amlodipine 10mg  daily. Recheck blood pressure still elevated. Plan to add hydrochlorothiazide 12.5mg  daily. Advised patient to closely monitor blood pressure as we change his dose. Return to office sooner if blood pressure begins to increase greater than 130/80. Follow-up in 4 weeks.      Orders: -     hydroCHLOROthiazide; Take 1 tablet (12.5 mg total) by mouth daily.  Dispense: 90 tablet; Refill: 3 -  amLODIPine Besylate; Take 1 tablet (10 mg total) by mouth daily.  Dispense: 30 tablet; Refill: 2  Asymptomatic microscopic hematuria Assessment & Plan: Patient reports history of trace RBCs in former urinalysis. Will complete UA with reflex microscopy today. Will update patient with results and plan of care depending on results.   Orders: -     Urinalysis, Routine w reflex microscopic     Return in about 5 weeks (around 11/04/2023) for HTN follow-up.    Alyson Reedy, FNP

## 2023-09-30 NOTE — Assessment & Plan Note (Signed)
Patient presents today with slightly elevated blood pressure, repeat still elevated. Patient in no acute distress and is well-appearing. Denies chest pain, shortness of breath, lower extremity edema, vision changes, headaches. Cardiovascular exam with heart regular rate and rhythm. Normal heart sounds, no murmurs present. No lower extremity edema present. Lungs clear to auscultation bilaterally. Patient is currently taking amlodipine 10mg  daily. Recheck blood pressure still elevated. Plan to add hydrochlorothiazide 12.5mg  daily. Advised patient to closely monitor blood pressure as we change his dose. Return to office sooner if blood pressure begins to increase greater than 130/80. Follow-up in 4 weeks.

## 2023-09-30 NOTE — Assessment & Plan Note (Signed)
Patient reports history of trace RBCs in former urinalysis. Will complete UA with reflex microscopy today. Will update patient with results and plan of care depending on results.

## 2023-10-01 LAB — URINALYSIS, ROUTINE W REFLEX MICROSCOPIC
Bilirubin, UA: NEGATIVE
Glucose, UA: NEGATIVE
Ketones, UA: NEGATIVE
Leukocytes,UA: NEGATIVE
Nitrite, UA: NEGATIVE
Protein,UA: NEGATIVE
Specific Gravity, UA: 1.017 (ref 1.005–1.030)
Urobilinogen, Ur: 0.2 mg/dL (ref 0.2–1.0)
pH, UA: 5.5 (ref 5.0–7.5)

## 2023-10-01 LAB — MICROSCOPIC EXAMINATION
Bacteria, UA: NONE SEEN
Casts: NONE SEEN /[LPF]
Epithelial Cells (non renal): NONE SEEN /[HPF] (ref 0–10)
RBC, Urine: NONE SEEN /[HPF] (ref 0–2)
WBC, UA: NONE SEEN /[HPF] (ref 0–5)

## 2023-10-07 ENCOUNTER — Other Ambulatory Visit (HOSPITAL_BASED_OUTPATIENT_CLINIC_OR_DEPARTMENT_OTHER): Payer: Self-pay | Admitting: Family Medicine

## 2023-10-07 DIAGNOSIS — R319 Hematuria, unspecified: Secondary | ICD-10-CM

## 2023-10-17 ENCOUNTER — Encounter (HOSPITAL_BASED_OUTPATIENT_CLINIC_OR_DEPARTMENT_OTHER): Payer: Self-pay | Admitting: Family Medicine

## 2023-11-04 ENCOUNTER — Ambulatory Visit (HOSPITAL_BASED_OUTPATIENT_CLINIC_OR_DEPARTMENT_OTHER): Payer: BC Managed Care – PPO | Admitting: Family Medicine

## 2023-11-21 ENCOUNTER — Ambulatory Visit (HOSPITAL_BASED_OUTPATIENT_CLINIC_OR_DEPARTMENT_OTHER): Payer: BC Managed Care – PPO | Admitting: Family Medicine

## 2023-11-25 ENCOUNTER — Ambulatory Visit (HOSPITAL_BASED_OUTPATIENT_CLINIC_OR_DEPARTMENT_OTHER): Payer: BC Managed Care – PPO | Admitting: Family Medicine

## 2024-03-17 ENCOUNTER — Encounter (HOSPITAL_BASED_OUTPATIENT_CLINIC_OR_DEPARTMENT_OTHER): Payer: Self-pay
